# Patient Record
Sex: Female | Born: 1980 | Race: Black or African American | Hispanic: No | Marital: Married | Smoking: Never smoker
Health system: Southern US, Community
[De-identification: ages and names within clinical notes are randomized; demographics above are authoritative.]

## PROBLEM LIST (undated history)

## (undated) DIAGNOSIS — I10 Essential (primary) hypertension: Secondary | ICD-10-CM

## (undated) DIAGNOSIS — F419 Anxiety disorder, unspecified: Secondary | ICD-10-CM

## (undated) DIAGNOSIS — T7840XA Allergy, unspecified, initial encounter: Secondary | ICD-10-CM

## (undated) HISTORY — PX: SALPINGOOPHORECTOMY: SHX82

## (undated) HISTORY — DX: Allergy, unspecified, initial encounter: T78.40XA

## (undated) HISTORY — DX: Essential (primary) hypertension: I10

## (undated) HISTORY — DX: Anxiety disorder, unspecified: F41.9

---
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
## 2001-12-16 ENCOUNTER — Encounter: Admission: RE | Admit: 2001-12-16 | Discharge: 2001-12-16 | Payer: Self-pay | Admitting: Obstetrics and Gynecology

## 2001-12-16 ENCOUNTER — Encounter: Payer: Self-pay | Admitting: Obstetrics and Gynecology

## 2002-08-28 ENCOUNTER — Emergency Department (HOSPITAL_COMMUNITY): Admission: EM | Admit: 2002-08-28 | Discharge: 2002-08-28 | Payer: Self-pay | Admitting: Emergency Medicine

## 2002-11-18 ENCOUNTER — Encounter: Payer: Self-pay | Admitting: Emergency Medicine

## 2002-11-18 ENCOUNTER — Emergency Department (HOSPITAL_COMMUNITY): Admission: EM | Admit: 2002-11-18 | Discharge: 2002-11-18 | Payer: Self-pay | Admitting: Emergency Medicine

## 2003-03-11 ENCOUNTER — Other Ambulatory Visit: Admission: RE | Admit: 2003-03-11 | Discharge: 2003-03-11 | Payer: Self-pay | Admitting: Obstetrics & Gynecology

## 2003-11-12 HISTORY — PX: TUBAL LIGATION: SHX77

## 2004-02-24 ENCOUNTER — Emergency Department (HOSPITAL_COMMUNITY): Admission: EM | Admit: 2004-02-24 | Discharge: 2004-02-24 | Payer: Self-pay | Admitting: Emergency Medicine

## 2004-09-27 ENCOUNTER — Emergency Department (HOSPITAL_COMMUNITY): Admission: EM | Admit: 2004-09-27 | Discharge: 2004-09-27 | Payer: Self-pay | Admitting: Emergency Medicine

## 2005-11-11 HISTORY — PX: OOPHORECTOMY: SHX86

## 2006-07-08 ENCOUNTER — Emergency Department (HOSPITAL_COMMUNITY): Admission: EM | Admit: 2006-07-08 | Discharge: 2006-07-08 | Payer: Self-pay | Admitting: Emergency Medicine

## 2006-09-06 ENCOUNTER — Encounter: Payer: Self-pay | Admitting: Emergency Medicine

## 2006-09-08 ENCOUNTER — Inpatient Hospital Stay (HOSPITAL_COMMUNITY): Admission: AD | Admit: 2006-09-08 | Discharge: 2006-09-09 | Payer: Self-pay | Admitting: Obstetrics and Gynecology

## 2007-02-04 ENCOUNTER — Emergency Department (HOSPITAL_COMMUNITY): Admission: EM | Admit: 2007-02-04 | Discharge: 2007-02-04 | Payer: Self-pay | Admitting: Family Medicine

## 2007-08-04 ENCOUNTER — Emergency Department (HOSPITAL_COMMUNITY): Admission: EM | Admit: 2007-08-04 | Discharge: 2007-08-04 | Payer: Self-pay | Admitting: Emergency Medicine

## 2007-09-16 ENCOUNTER — Emergency Department (HOSPITAL_COMMUNITY): Admission: EM | Admit: 2007-09-16 | Discharge: 2007-09-16 | Payer: Self-pay | Admitting: Family Medicine

## 2008-03-03 LAB — CONVERTED CEMR LAB: Pap Smear: NORMAL

## 2008-06-26 ENCOUNTER — Emergency Department (HOSPITAL_BASED_OUTPATIENT_CLINIC_OR_DEPARTMENT_OTHER): Admission: EM | Admit: 2008-06-26 | Discharge: 2008-06-26 | Payer: Self-pay | Admitting: Emergency Medicine

## 2009-01-01 ENCOUNTER — Ambulatory Visit: Payer: Self-pay | Admitting: Diagnostic Radiology

## 2009-01-01 ENCOUNTER — Emergency Department (HOSPITAL_BASED_OUTPATIENT_CLINIC_OR_DEPARTMENT_OTHER): Admission: EM | Admit: 2009-01-01 | Discharge: 2009-01-01 | Payer: Self-pay | Admitting: Emergency Medicine

## 2009-01-18 ENCOUNTER — Ambulatory Visit: Payer: Self-pay | Admitting: *Deleted

## 2009-01-18 DIAGNOSIS — R51 Headache: Secondary | ICD-10-CM

## 2009-01-18 DIAGNOSIS — R519 Headache, unspecified: Secondary | ICD-10-CM | POA: Insufficient documentation

## 2009-01-18 DIAGNOSIS — M5126 Other intervertebral disc displacement, lumbar region: Secondary | ICD-10-CM

## 2009-01-18 DIAGNOSIS — M549 Dorsalgia, unspecified: Secondary | ICD-10-CM | POA: Insufficient documentation

## 2009-01-22 ENCOUNTER — Encounter: Admission: RE | Admit: 2009-01-22 | Discharge: 2009-01-22 | Payer: Self-pay | Admitting: *Deleted

## 2009-01-24 ENCOUNTER — Telehealth (INDEPENDENT_AMBULATORY_CARE_PROVIDER_SITE_OTHER): Payer: Self-pay | Admitting: *Deleted

## 2009-01-25 ENCOUNTER — Encounter: Admission: RE | Admit: 2009-01-25 | Discharge: 2009-01-25 | Payer: Self-pay | Admitting: *Deleted

## 2009-01-31 ENCOUNTER — Encounter (INDEPENDENT_AMBULATORY_CARE_PROVIDER_SITE_OTHER): Payer: Self-pay | Admitting: *Deleted

## 2009-03-20 ENCOUNTER — Ambulatory Visit: Payer: Self-pay | Admitting: Gastroenterology

## 2009-04-06 ENCOUNTER — Emergency Department (HOSPITAL_BASED_OUTPATIENT_CLINIC_OR_DEPARTMENT_OTHER): Admission: EM | Admit: 2009-04-06 | Discharge: 2009-04-06 | Payer: Self-pay | Admitting: Emergency Medicine

## 2009-04-13 ENCOUNTER — Ambulatory Visit: Payer: Self-pay | Admitting: Internal Medicine

## 2009-04-13 DIAGNOSIS — K2970 Gastritis, unspecified, without bleeding: Secondary | ICD-10-CM | POA: Insufficient documentation

## 2009-04-13 DIAGNOSIS — K2990 Gastroduodenitis, unspecified, without bleeding: Secondary | ICD-10-CM

## 2010-01-26 ENCOUNTER — Emergency Department (HOSPITAL_COMMUNITY): Admission: EM | Admit: 2010-01-26 | Discharge: 2010-01-26 | Payer: Self-pay | Admitting: Emergency Medicine

## 2011-02-26 LAB — BASIC METABOLIC PANEL
CO2: 25 mEq/L (ref 19–32)
Chloride: 107 mEq/L (ref 96–112)
Creatinine, Ser: 1 mg/dL (ref 0.4–1.2)
GFR calc Af Amer: >60 mL/min (ref >60)
Potassium: 4 mEq/L (ref 3.5–5.1)
Sodium: 140 mEq/L (ref 135–145)

## 2011-02-26 LAB — DIFFERENTIAL
Basophils Relative: 1 % (ref 0–1)
Eosinophils Absolute: 0.2 10*3/uL (ref 0.0–0.7)
Eosinophils Relative: 2 % (ref 0–5)
Monocytes Absolute: 0.8 10*3/uL (ref 0.1–1.0)
Monocytes Relative: 9 % (ref 3–12)

## 2011-02-26 LAB — URINALYSIS, ROUTINE W REFLEX MICROSCOPIC
Glucose, UA: NEGATIVE mg/dL
Hgb urine dipstick: NEGATIVE
Protein, ur: NEGATIVE mg/dL

## 2011-02-26 LAB — CBC
HCT: 38.9 % (ref 36.0–46.0)
Hemoglobin: 13.3 g/dL (ref 12.0–15.0)
MCHC: 34.1 g/dL (ref 30.0–36.0)
MCV: 84.5 fL (ref 78.0–100.0)
RBC: 4.6 MIL/uL (ref 3.87–5.11)

## 2011-03-12 ENCOUNTER — Inpatient Hospital Stay (INDEPENDENT_AMBULATORY_CARE_PROVIDER_SITE_OTHER)
Admission: RE | Admit: 2011-03-12 | Discharge: 2011-03-12 | Disposition: A | Discharge status: Home / Self Care | Payer: Self-pay | Source: Ambulatory Visit | Attending: Family Medicine | Admitting: Family Medicine

## 2011-03-12 DIAGNOSIS — J309 Allergic rhinitis, unspecified: Secondary | ICD-10-CM

## 2011-03-12 DIAGNOSIS — J019 Acute sinusitis, unspecified: Secondary | ICD-10-CM

## 2011-03-15 ENCOUNTER — Inpatient Hospital Stay (HOSPITAL_COMMUNITY): Payer: Self-pay

## 2011-03-15 ENCOUNTER — Emergency Department (HOSPITAL_COMMUNITY)
Admission: EM | Admit: 2011-03-15 | Discharge: 2011-03-15 | Disposition: A | Discharge status: Nursing Home (Includes ICF) | Payer: Self-pay | Source: Home / Self Care | Attending: Emergency Medicine | Admitting: Emergency Medicine

## 2011-03-15 ENCOUNTER — Emergency Department (HOSPITAL_COMMUNITY): Payer: Self-pay

## 2011-03-15 ENCOUNTER — Inpatient Hospital Stay (HOSPITAL_COMMUNITY)
Admission: AD | Admit: 2011-03-15 | Discharge: 2011-03-21 | DRG: 743 | Disposition: A | Discharge status: Home / Self Care | Payer: Self-pay | Source: Ambulatory Visit | Attending: Obstetrics & Gynecology | Admitting: Obstetrics & Gynecology

## 2011-03-15 DIAGNOSIS — R109 Unspecified abdominal pain: Secondary | ICD-10-CM | POA: Insufficient documentation

## 2011-03-15 DIAGNOSIS — R300 Dysuria: Secondary | ICD-10-CM | POA: Insufficient documentation

## 2011-03-15 DIAGNOSIS — R1900 Intra-abdominal and pelvic swelling, mass and lump, unspecified site: Secondary | ICD-10-CM

## 2011-03-15 DIAGNOSIS — K5900 Constipation, unspecified: Secondary | ICD-10-CM | POA: Diagnosis present

## 2011-03-15 DIAGNOSIS — R110 Nausea: Secondary | ICD-10-CM | POA: Insufficient documentation

## 2011-03-15 DIAGNOSIS — R1032 Left lower quadrant pain: Secondary | ICD-10-CM | POA: Diagnosis present

## 2011-03-15 DIAGNOSIS — N83209 Unspecified ovarian cyst, unspecified side: Principal | ICD-10-CM | POA: Diagnosis present

## 2011-03-15 DIAGNOSIS — N949 Unspecified condition associated with female genital organs and menstrual cycle: Secondary | ICD-10-CM | POA: Insufficient documentation

## 2011-03-15 DIAGNOSIS — R197 Diarrhea, unspecified: Secondary | ICD-10-CM | POA: Insufficient documentation

## 2011-03-15 LAB — BASIC METABOLIC PANEL
BUN: 10 mg/dL (ref 6–23)
Chloride: 103 mEq/L (ref 96–112)
Creatinine, Ser: 0.89 mg/dL (ref 0.4–1.2)
Glucose, Bld: 104 mg/dL — ABNORMAL HIGH (ref 70–99)

## 2011-03-15 LAB — URINALYSIS, ROUTINE W REFLEX MICROSCOPIC
Glucose, UA: NEGATIVE mg/dL
Hgb urine dipstick: NEGATIVE
Ketones, ur: NEGATIVE mg/dL
Protein, ur: NEGATIVE mg/dL
pH: 6 (ref 5.0–8.0)

## 2011-03-15 LAB — CBC
HCT: 38.5 % (ref 36.0–46.0)
MCH: 28 pg (ref 26.0–34.0)
MCV: 84.2 fL (ref 78.0–100.0)
Platelets: 380 10*3/uL (ref 150–400)
RBC: 4.57 MIL/uL (ref 3.87–5.11)
RDW: 13.3 % (ref 11.5–15.5)

## 2011-03-15 LAB — DIFFERENTIAL
Eosinophils Absolute: 0.3 10*3/uL (ref 0.0–0.7)
Eosinophils Relative: 4 % (ref 0–5)
Lymphocytes Relative: 31 % (ref 12–46)
Lymphs Abs: 2.3 10*3/uL (ref 0.7–4.0)
Monocytes Relative: 9 % (ref 3–12)

## 2011-03-17 DIAGNOSIS — R1032 Left lower quadrant pain: Secondary | ICD-10-CM

## 2011-03-17 DIAGNOSIS — K5900 Constipation, unspecified: Secondary | ICD-10-CM

## 2011-03-17 DIAGNOSIS — N83209 Unspecified ovarian cyst, unspecified side: Secondary | ICD-10-CM

## 2011-03-20 ENCOUNTER — Ambulatory Visit (HOSPITAL_COMMUNITY): Payer: Self-pay | Attending: Obstetrics & Gynecology

## 2011-03-20 ENCOUNTER — Other Ambulatory Visit: Payer: Self-pay | Admitting: Diagnostic Radiology

## 2011-03-20 DIAGNOSIS — N83209 Unspecified ovarian cyst, unspecified side: Secondary | ICD-10-CM | POA: Insufficient documentation

## 2011-03-20 DIAGNOSIS — Z9079 Acquired absence of other genital organ(s): Secondary | ICD-10-CM | POA: Insufficient documentation

## 2011-03-20 DIAGNOSIS — R1032 Left lower quadrant pain: Secondary | ICD-10-CM | POA: Insufficient documentation

## 2011-03-20 LAB — APTT: aPTT: 31 seconds (ref 24–37)

## 2011-03-20 LAB — CBC
HCT: 36.4 % (ref 36.0–46.0)
MCH: 27.7 pg (ref 26.0–34.0)
MCHC: 32.7 g/dL (ref 30.0–36.0)
MCV: 84.8 fL (ref 78.0–100.0)
Platelets: 351 10*3/uL (ref 150–400)
RDW: 13.2 % (ref 11.5–15.5)
WBC: 7.6 10*3/uL (ref 4.0–10.5)

## 2011-03-22 NOTE — Discharge Summary (Signed)
[name], [name]              ACCOUNT NO.:  [id number]  MEDICAL RECORD NO.:  [id number]           PATIENT TYPE:  I  LOCATION:  9318                          [facility]  [facility]  PHYSICIAN:  Tanya S. Shawnie Pons, M.D.   DATE OF BIRTH:  11/10/1981  DATE OF ADMISSION:  03/15/2011 DATE OF DISCHARGE:  03/21/2011                              DISCHARGE SUMMARY   FINAL DIAGNOSES: 1. Acute abdominal pain related to an ovarian cyst. 2. History of pelvic adhesive disease.  PERTINENT LABORATORY VALUES:  CBC with hemoglobin of 11.9, white count of 7.6.  Normal coags.  Other pertinent findings include pelvic sonography which revealed a 5.9 x 4.7 x 4.3 cm left adnexal cyst that appeared benign and may have been related to an ovarian remnant. Supposedly, she had had an LSO, however, upon review of the operative reports from that procedure it was unclear they were able to get her entire ovary out.  REASON FOR ADMISSION:  Briefly, please see H and P on the chart.  The patient is a 30 year old female who is a G0, who presented with sudden onset of left lower quadrant pain to the [facility] ED, was treated by IV morphine and Dilaudid, and needed to be admitted for pain control.  HOSPITAL COURSE:  The patient was admitted by Dr. Harold Hedge who transferred the patient to the faculty practice on hospital day #2.  She was placed on fentanyl PCA for a couple of days.  She was then transitioned over to IV Toradol and IV Dilaudid which the patient refused.  The patient continued to have pain and was monitored for a couple of more days until she was saying she had too much pain to go home and Interventional Radiology was consulted.  At that time, they placed a drain through the iliopsoas and drained 42 mL of fluid.  Cyst went down by several centimeters.  On the next day, day of discharge, the patient reported that her pain was somewhat improved.  She continued to have some nausea, though was on just IM  Toradol for pain relief.  Her abdominal exam was somewhat improved, so it was felt she was stable for discharge.  DISCHARGE DISPOSITION AND CONDITION:  She is discharged home in improved condition.  Followup will be on Apr 04, 2011, at 4 p.m. in the GYN Clinic.  DISCHARGE MEDICATIONS:  Allegra 60 mg p.o. b.i.d. as needed.  She will stop Zithromax which she was on prior to admission.  Vitaline 1 tablet daily by mouth. she can receive at home, Zyrtec one daily as needed, she should not take that with her Allegra.  Flonase 2 sprays each nostril daily as needed.  New medications include diclofenac 75 mg twice daily with food as needed for pain and Phenergan 25 mg every 4-6 hours p.r.n. nausea.  The patient was instructed to increase her activity slowly and that she continued to have resolution of her pain. She will need a followup ultrasound in 6 weeks.     Shelbie Proctor. Shawnie Pons, M.D.     TSP/MEDQ  D:  03/21/2011  T:  03/21/2011  Job:  [id number]  Electronically Signed by Tinnie Gens M.D. on 03/22/2011 01:36:05 PM

## 2011-03-29 NOTE — Discharge Summary (Signed)
[name], [name]              ACCOUNT NO.:  [id number]   MEDICAL RECORD NO.:  [id number]          PATIENT TYPE:  INP   LOCATION:  9318                          [facility]ILITY:  [facility]   PHYSICIAN:  James A. Ashley Royalty, M.D.DATE OF BIRTH:  [birth year]   DATE OF ADMISSION:  09/07/2006  DATE OF DISCHARGE:  09/09/2006                               DISCHARGE SUMMARY   DISCHARGE DIAGNOSES:  1. Left ovarian cyst.  2. Left lower quadrant pain.   OPERATIONS AND SPECIAL PROCEDURES:  None.   CONSULTATIONS:  None.   DISCHARGE MEDICATIONS:  Fentanyl 0.5 mcg patch.   HISTORY AND PHYSICAL:  This is a 30 year old gravida 1, para 0, AB 1,  whom I evaluated in the context of unassigned GYN call.  She was  transferred from the [facility] emergency department complaining of  sharp pain in the left lower quadrant.  She was evaluated with  ultrasound, which revealed a 6 cm complex ovarian cyst with peripheral  vascular flow along the margins.  The patient was a patient of Dr.  Ruby Cola and had undergone bilateral salpingectomy December 2005 in an  effort to optimize her fertility.  She was admitted for pain control.  For the remainder of the history and physical, please see chart.   HOSPITAL COURSE:  The patient was admitted to the [facility]  [place].  Admission laboratory studies were drawn.  She had an  initial temperature of 100.5 degrees Fahrenheit.  She was given Vicodin  for pain.  Dr. Ruby Cola was called and returned my call on September 08, 2006.  We formulated a plan that he was to receive the patient back and  manage her left ovarian cyst in the context of her fertility treatment.  On September 09, 2006, the patient was noted to be afebrile.  She was  given a fentanyl patch.  She seemed to tolerate that well and was a  candidate for transfer back to the care of Dr. Ruby Cola.  As her pain  was still substantial, I communicated with him and we sent her directly  to [facility]  rather than to his office.  He will resume care  there.  The patient was thus discharged and transferred to the care of  Dr. Ruby Cola on September 09, 2006.   DISPOSITION:  As above.      James A. Ashley Royalty, M.D.  Electronically Signed     JAM/MEDQ  D:  10/16/2006  T:  10/16/2006  Job:  [id number]

## 2011-04-04 ENCOUNTER — Ambulatory Visit (INDEPENDENT_AMBULATORY_CARE_PROVIDER_SITE_OTHER): Payer: Self-pay | Admitting: Family Medicine

## 2011-04-04 DIAGNOSIS — N83209 Unspecified ovarian cyst, unspecified side: Secondary | ICD-10-CM

## 2011-04-05 NOTE — Group Therapy Note (Signed)
[name], [name]              ACCOUNT NO.:  [id number]  MEDICAL RECORD NO.:  [id number]           PATIENT TYPE:  A  LOCATION:  [facility]                   [facility]  [facility]  PHYSICIAN:  Tinnie Gens, MD        DATE OF BIRTH:  [birth year]  DATE OF SERVICE:  04/04/2011                                 CLINIC NOTE  CHIEF COMPLAINT:  Hospital followup.  HISTORY OF PRESENT ILLNESS:  The patient is a 29 year old nulliparous patient who was admitted from May 4 to May 10.  She was admitted with abdominal pain related to an ovarian cyst.  She had it drained through Interventional Radiology and has done well.  She has been home for a little bit and reports that her pain is much improved.  She is between jobs and do not have health insurance at present.  She thinks that Dr. Ruby Cameron took out both of her tubes and her left ovary but she is interested in getting pregnant and so we discussed that at length today.  PAST MEDICAL HISTORY:  Negative.  PAST SURGICAL HISTORY:  She had an appendectomy at 2, bilateral tubal removal in 2005 and left ovary removal in 2007.  MEDICATIONS:  She is on; 1. Diclofenac 75 mg 2 p.o. daily. 2. Phenergan 25 mg p.o. daily. 3. Allegra-D 1 p.o. daily as needed.  ALLERGIES:  PENICILLIN, CODEINE, MORPHINE, HYDROMORPHONE.  OBSTETRICAL HISTORY:  She is G0.  GYNECOLOGIC HISTORY:  Menarche at age 7.  Cycles are 22 days and lasts for 4 days with moderately moderate pain.  She has a history of ovarian cyst.  No history of abnormal Pap.  Last Pap was in 2009.  FAMILY HISTORY:  Hypertension in mother.  SOCIAL HISTORY:  No tobacco, alcohol or drug use.  She is working as a Hospital doctor for [employer].  She has a degree in child care.  She is married.  No kids.  REVIEW OF SYSTEMS:  14-point review of systems reviewed.  Complains of weight gain, headache, abdominal pain, constipation from pain medications.  PHYSICAL EXAMINATION:  Today; VITAL SIGNS:   As noted in the chart. GENERAL:  She is a well-developed, well-nourished female in no acute distress. ABDOMEN:  Soft, nontender, nondistended.  IMPRESSION: 1. Pelvic adhesive disease. 2. History of ovarian cyst now resolving status post hospitalization.  PLAN:  We will send the patient to [facility].  She needs a Pap smear in the next 6-8 weeks.  Also the patient will like copy of her records. We did discuss the fertility and she will look into that when she has her health insurance.          ______________________________ Tinnie Gens, MD    TP/MEDQ  D:  04/04/2011  T:  04/05/2011  Job:  [id number]

## 2011-04-14 ENCOUNTER — Inpatient Hospital Stay (HOSPITAL_COMMUNITY)
Admission: AD | Admit: 2011-04-14 | Discharge: 2011-04-14 | Disposition: A | Discharge status: Home / Self Care | Payer: Self-pay | Source: Ambulatory Visit | Attending: Obstetrics and Gynecology | Admitting: Obstetrics and Gynecology

## 2011-04-14 ENCOUNTER — Inpatient Hospital Stay (HOSPITAL_COMMUNITY): Payer: Self-pay

## 2011-04-14 DIAGNOSIS — O99891 Other specified diseases and conditions complicating pregnancy: Secondary | ICD-10-CM | POA: Insufficient documentation

## 2011-04-14 DIAGNOSIS — R1032 Left lower quadrant pain: Secondary | ICD-10-CM | POA: Insufficient documentation

## 2011-04-14 DIAGNOSIS — O9989 Other specified diseases and conditions complicating pregnancy, childbirth and the puerperium: Secondary | ICD-10-CM

## 2011-04-14 LAB — CBC
HCT: 38.4 % (ref 36.0–46.0)
Hemoglobin: 13 g/dL (ref 12.0–15.0)
MCH: 28.8 pg (ref 26.0–34.0)
MCHC: 33.9 g/dL (ref 30.0–36.0)
MCV: 85.1 fL (ref 78.0–100.0)
RBC: 4.51 MIL/uL (ref 3.87–5.11)

## 2011-04-14 LAB — URINALYSIS, ROUTINE W REFLEX MICROSCOPIC
Bilirubin Urine: NEGATIVE
Glucose, UA: NEGATIVE mg/dL
Ketones, ur: 15 mg/dL — AB
Specific Gravity, Urine: >1.03 — ABNORMAL HIGH (ref 1.005–1.030)
pH: 6.5 (ref 5.0–8.0)

## 2011-04-14 LAB — URINE MICROSCOPIC-ADD ON

## 2011-05-16 ENCOUNTER — Other Ambulatory Visit: Payer: Self-pay | Admitting: Family Medicine

## 2011-05-16 ENCOUNTER — Ambulatory Visit (INDEPENDENT_AMBULATORY_CARE_PROVIDER_SITE_OTHER): Payer: Self-pay | Admitting: Family Medicine

## 2011-05-16 DIAGNOSIS — Z01419 Encounter for gynecological examination (general) (routine) without abnormal findings: Secondary | ICD-10-CM

## 2011-05-17 ENCOUNTER — Emergency Department (INDEPENDENT_AMBULATORY_CARE_PROVIDER_SITE_OTHER): Payer: Self-pay

## 2011-05-17 ENCOUNTER — Emergency Department (HOSPITAL_BASED_OUTPATIENT_CLINIC_OR_DEPARTMENT_OTHER)
Admission: EM | Admit: 2011-05-17 | Discharge: 2011-05-17 | Disposition: A | Discharge status: Home / Self Care | Payer: Self-pay | Attending: Emergency Medicine | Admitting: Emergency Medicine

## 2011-05-17 DIAGNOSIS — W278XXA Contact with other nonpowered hand tool, initial encounter: Secondary | ICD-10-CM

## 2011-05-17 DIAGNOSIS — M795 Residual foreign body in soft tissue: Secondary | ICD-10-CM | POA: Insufficient documentation

## 2011-05-17 DIAGNOSIS — Z1889 Other specified retained foreign body fragments: Secondary | ICD-10-CM | POA: Insufficient documentation

## 2011-05-17 DIAGNOSIS — S60459A Superficial foreign body of unspecified finger, initial encounter: Secondary | ICD-10-CM

## 2011-05-17 NOTE — Group Therapy Note (Signed)
NAMEPRITIKA, [name]              ACCOUNT NO.:  [id number]  MEDICAL RECORD NO.:  [id number]           PATIENT TYPE:  E  LOCATION:  [facility]                    FACILITY:  [facility]  PHYSICIAN:  Tinnie Gens, MD        DATE OF BIRTH:  1981/09/27  DATE OF SERVICE:  05/16/2011                                 CLINIC NOTE  CHIEF COMPLAINT:  Yearly exam with Pap.  HISTORY OF PRESENT ILLNESS:  The patient is a 30 year old nulliparous patient.  She was previously seen in this hospital followup on 04/04/2011.  At that time, she was admitted with pain related to an ovarian cyst, which was drained through interventional radiology.  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  She is to see Rudell Cobb after her last visit.  Her mom had recent brain surgery in [place]. She has been only taking care of her.  The patient has had both tubes removed.  IVF for fertility purposes on a later date.  The patient has mild right-sided pelvic pain currently, which was stabbing at times. She also noted some increased breast pain after starting oral contraceptives.  Additionally, she had increased cramping with her last period and her cycle was left.  We could start her on oral contraceptives at her last visit that we keep her ovary somewhat quiescent.  PAST MEDICAL HISTORY:  Negative.  PAST SURGICAL HISTORY:  Appendectomy at age 35, bilateral tubal removal in 2005, left ovary removal in 2007.  MEDICATIONS:  Diclofenac 75 mg two p.o. daily, Phenergan 25 mg one p.o. daily p.r.n., Allegra-D one p.o. daily as needed, Alcon 135 one p.o. daily.  ALLERGIES:  PENICILLIN, CODEINE, MORPHINE, HYDROMORPHONE.  OBSTETRICAL HISTORY:  She is a G 0.  GYN HISTORY:  Menarche at age 45.  Cycles 22 days, last 4 days with moderate pain, history of ovarian cysts.  No history of abnormal Pap.  FAMILY HISTORY:  Hypertension.  SOCIAL HISTORY:  She denies tobacco, alcohol, or drug use.  FAMILY HISTORY:  Significant for  hypertension in her mother.  Her mother also had a benign brain tumor hemangioma removed from her head.  REVIEW OF SYSTEMS:  A 14-point review of systems reviewed with exception of the HPI that is negative.  PHYSICAL EXAMINATION:  Vitals:  As noted in the chart.  General:  She is a well-nourished female in no acute distress.  Lungs:  Clear bilaterally.  CVS:  Regular rate and rhythm without murmurs.  Neck: Supple.  Normal thyroid.  Abdomen:  Soft, nontender, nondistended. Extremities:  No cyanosis, clubbing, or edema, 2+ distal pulses. Breasts:  Symmetric with everted nipples.  No masses, supraclavicular or axillary lymphadenopathy.  GU:  Normal external female genitalia. Vagina:  Pink and rugated.  Cervix is nulliparous without lesion. Uterus is small, anteverted.  No adnexal mass or tenderness.  IMPRESSION: 1. Gyn exam with Pap. 2. Chronic pelvic pain with multiple previous surgeries, history of     right ovarian cyst.  PLAN: 1. Continue oral contraceptives. 2. Pap smear today. 3. She will follow up in September to see how the oral contraceptives are  working for a pelvic pain.          ______________________________ Tinnie Gens, MD    TP/MEDQ  D:  05/16/2011  T:  05/17/2011  Job:  [id number]

## 2011-08-21 ENCOUNTER — Inpatient Hospital Stay (HOSPITAL_COMMUNITY)
Admission: RE | Admit: 2011-08-21 | Discharge: 2011-08-21 | Disposition: A | Discharge status: Home / Self Care | Payer: Self-pay | Source: Ambulatory Visit | Attending: Family Medicine | Admitting: Family Medicine

## 2011-11-12 HISTORY — PX: OVARIAN CYST SURGERY: SHX726

## 2012-01-25 IMAGING — US US TRANSVAGINAL NON-OB
1 series · 14 of 25 positions shown · non-contrast
Comparison: CT pelvis of 03/20/2011 and ultrasound pelvis of
03/15/2011

CLINICAL DATA: Pain

TRANSVAGINAL ULTRASOUND OF PELVIS
TECHNIQUE: Transvaginal ultrasound examination of the pelvis was
performed including evaluation of the uterus, ovaries, adnexal
regions, and pelvic cul-de-sac.

[Series 1: us transvaginal non-ob · 14 of 27 slices shown]
[im 1/27]
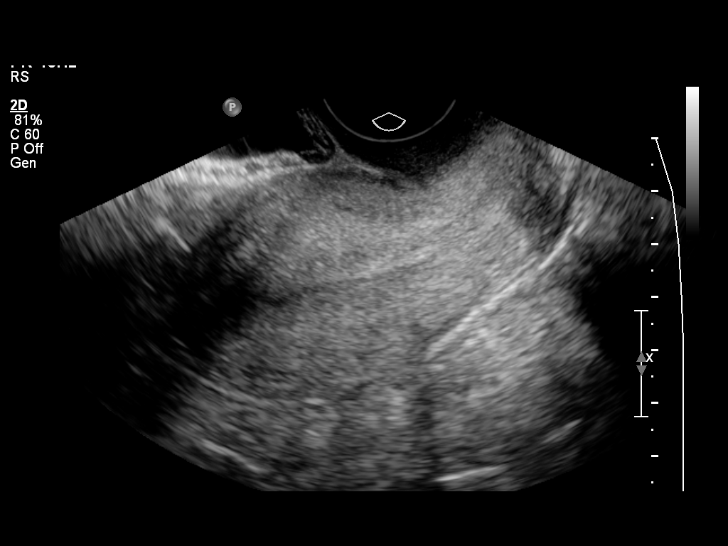
[im 3/27]
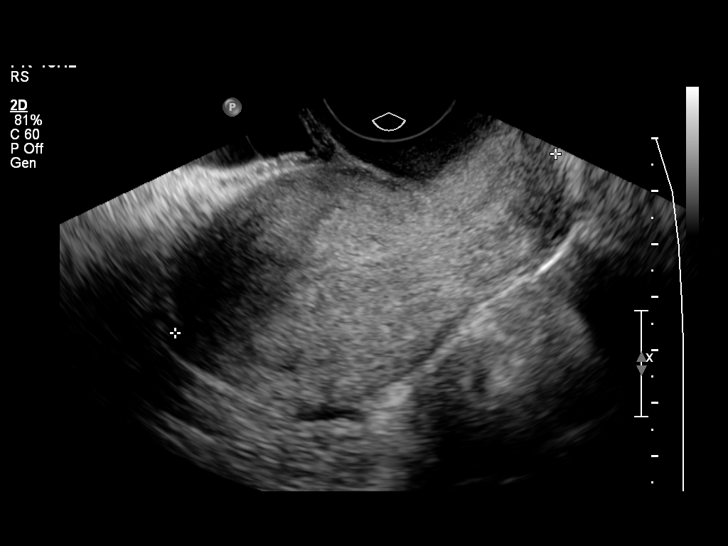
[im 5/27]
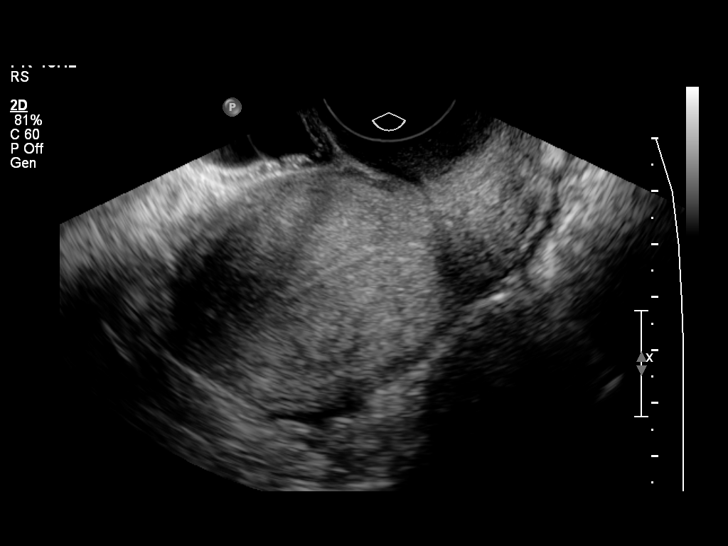
[im 7/27]
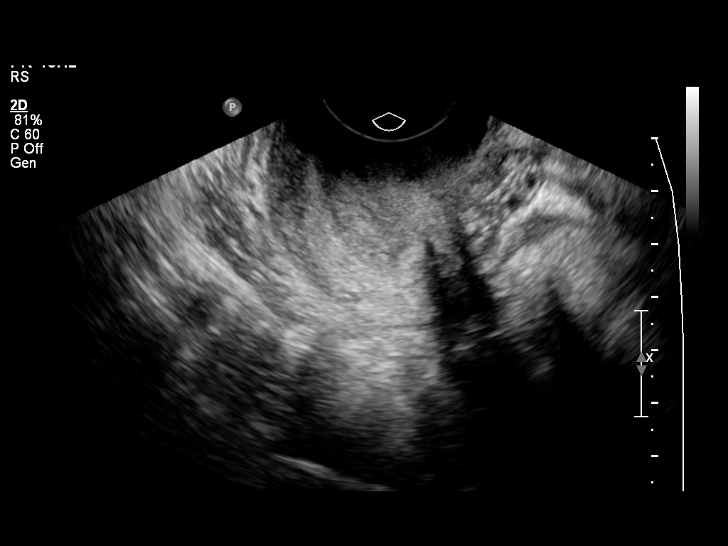
[im 9/27]
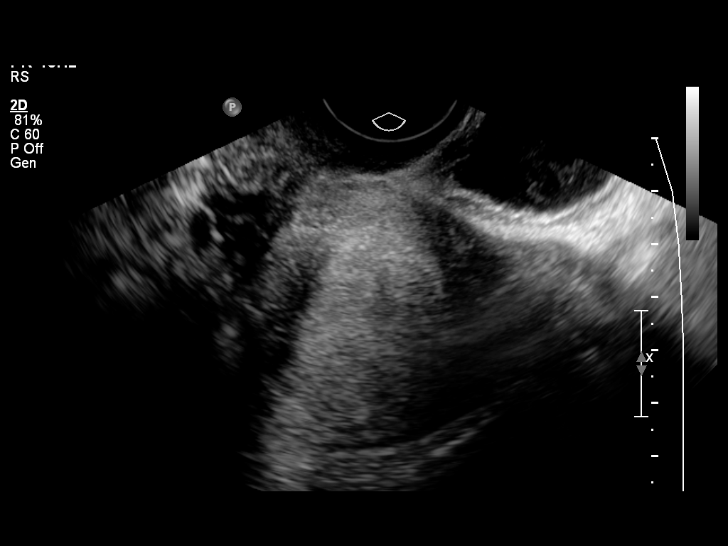
[im 10/27]
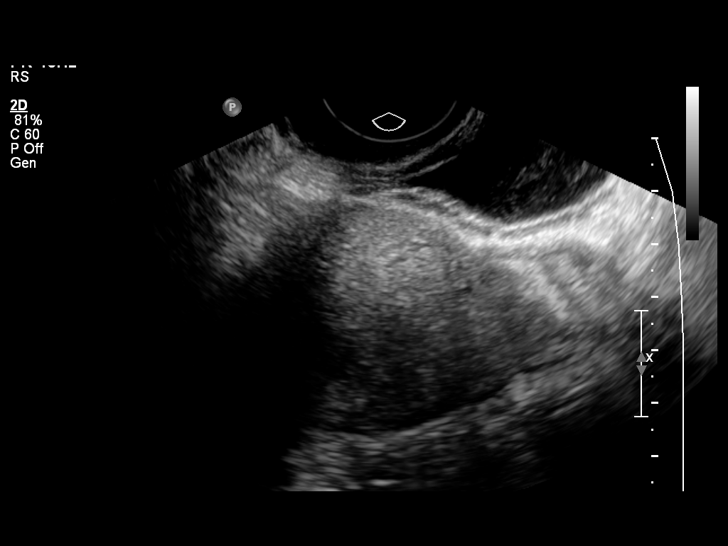
[im 12/27]
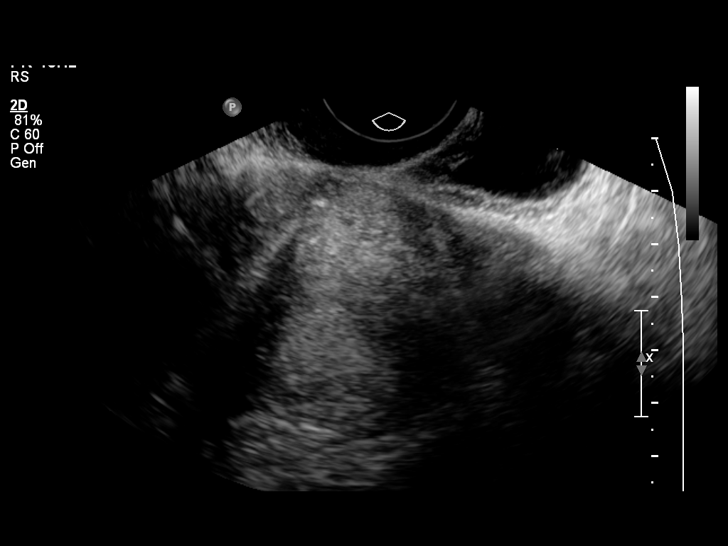
[im 15/27]
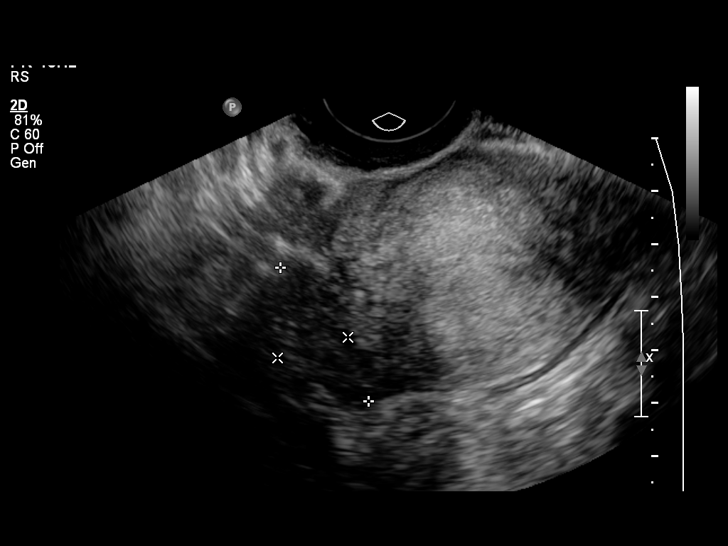
[im 17/27]
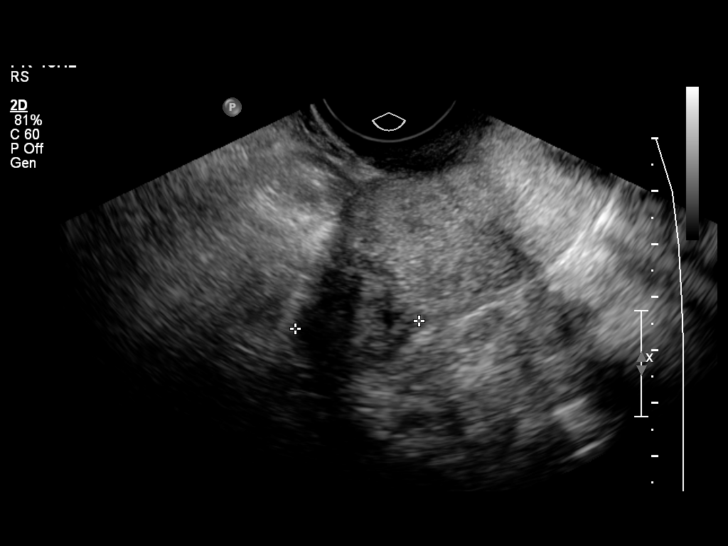
[im 18/27]
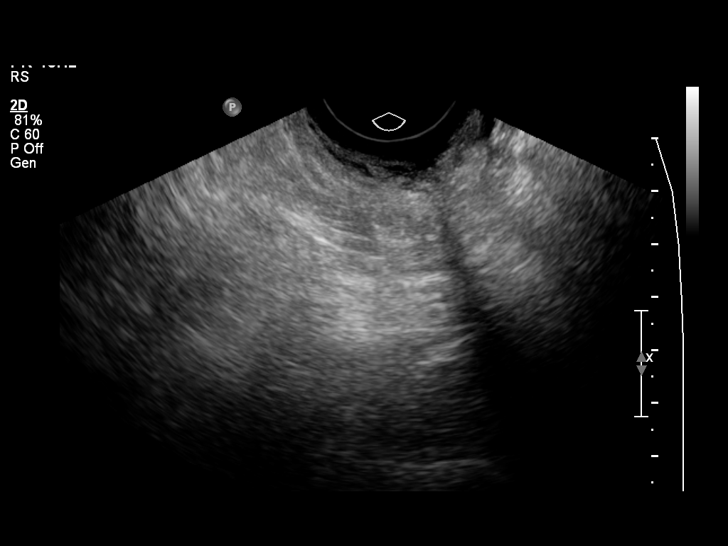
[im 20/27]
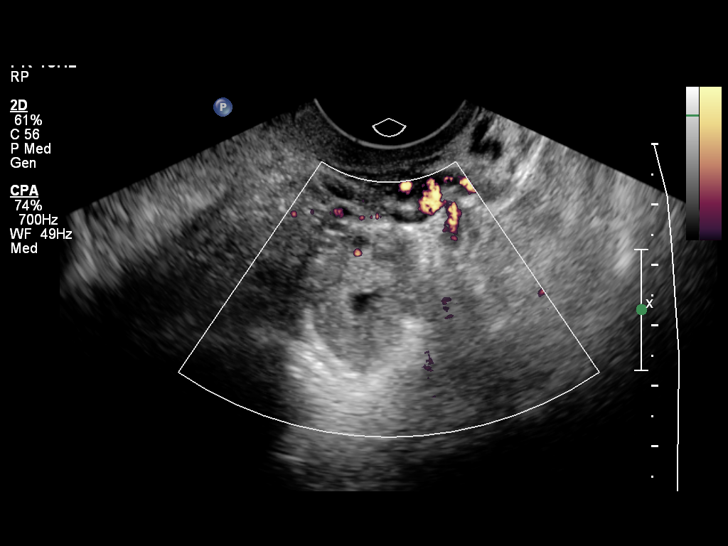
[im 22/27]
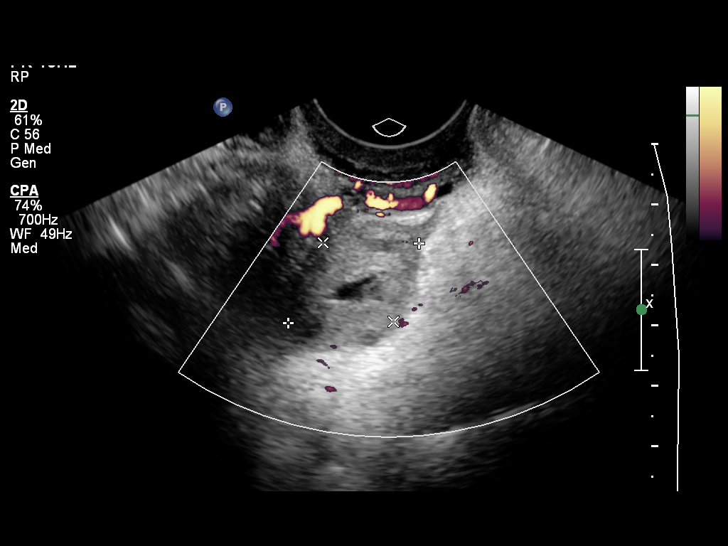
[im 24/27]
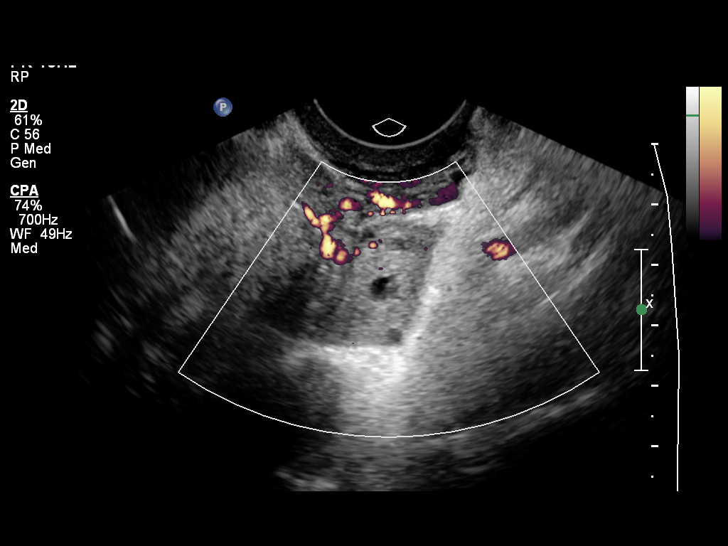
[im 27/27]
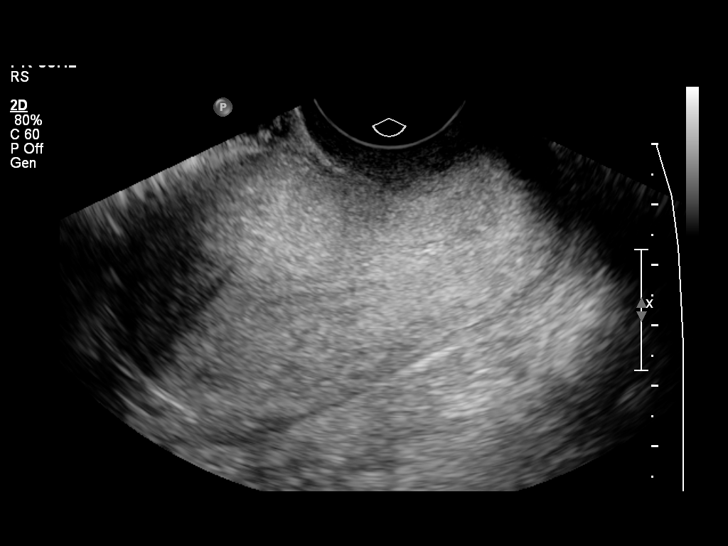

[14 of 25 positions shown; findings below may reference images not displayed]

FINDINGS: Uterus the uterus measures 7.9 cm sagittally with a depth of 4.3 cm
and width of 5.2 cm.

Endometrium the endometrium is normal in thickness measuring 3 mm.

Right Ovary  the right ovary is normal measuring 3.0 x 1.4 x
cm.

Left Ovary the left ovary measures 2.5 x 1.8 x 1.7 cm.  The
previously identified left adnexal cyst has been almost completely
aspirated.

Other Findings:  No free fluid is noted.
IMPRESSION: 1.  No uterine or myometrial abnormality.
2.  The previously described left adnexal cyst has been aspirated.
No free fluid is seen.

## 2012-03-19 ENCOUNTER — Telehealth: Payer: Self-pay | Admitting: *Deleted

## 2012-03-19 NOTE — Telephone Encounter (Signed)
Pt called and left a message wanting to know when her last pap smear was. I informed her it was July of 2012. Pt requested a copy of the result, I advised her to come by office and sign release and we can give it to her.

## 2012-08-12 ENCOUNTER — Ambulatory Visit
Admission: RE | Admit: 2012-08-12 | Discharge: 2012-08-12 | Disposition: A | Discharge status: Home / Self Care | Payer: BC Managed Care – PPO | Source: Ambulatory Visit | Attending: Chiropractic Medicine | Admitting: Chiropractic Medicine

## 2012-08-12 ENCOUNTER — Other Ambulatory Visit: Payer: Self-pay | Admitting: Chiropractic Medicine

## 2012-08-12 DIAGNOSIS — M5412 Radiculopathy, cervical region: Secondary | ICD-10-CM

## 2012-08-12 DIAGNOSIS — M545 Low back pain: Secondary | ICD-10-CM

## 2012-08-12 DIAGNOSIS — M542 Cervicalgia: Secondary | ICD-10-CM

## 2012-12-18 ENCOUNTER — Encounter (HOSPITAL_BASED_OUTPATIENT_CLINIC_OR_DEPARTMENT_OTHER): Payer: Self-pay

## 2012-12-18 ENCOUNTER — Emergency Department (HOSPITAL_BASED_OUTPATIENT_CLINIC_OR_DEPARTMENT_OTHER)
Admission: EM | Admit: 2012-12-18 | Discharge: 2012-12-18 | Disposition: A | Discharge status: Home / Self Care | Payer: BC Managed Care – PPO | Attending: Emergency Medicine | Admitting: Emergency Medicine

## 2012-12-18 DIAGNOSIS — M79603 Pain in arm, unspecified: Secondary | ICD-10-CM

## 2012-12-18 DIAGNOSIS — R209 Unspecified disturbances of skin sensation: Secondary | ICD-10-CM | POA: Insufficient documentation

## 2012-12-18 DIAGNOSIS — R5381 Other malaise: Secondary | ICD-10-CM | POA: Insufficient documentation

## 2012-12-18 DIAGNOSIS — Z7982 Long term (current) use of aspirin: Secondary | ICD-10-CM | POA: Insufficient documentation

## 2012-12-18 DIAGNOSIS — M79609 Pain in unspecified limb: Secondary | ICD-10-CM | POA: Insufficient documentation

## 2012-12-18 MED ORDER — NAPROXEN 375 MG PO TABS: 375.0000 mg | ORAL_TABLET | Freq: Two times a day (BID) | ORAL | Status: DC

## 2012-12-18 NOTE — ED Notes (Signed)
Pt complains of numbness in right arm that radiates into shoulder.  Describes it as a throbbing ache and inability to pick daughter up.

## 2012-12-18 NOTE — ED Provider Notes (Signed)
Medical screening examination/treatment/procedure(s) were performed by non-physician practitioner and as supervising physician I was immediately available for consultation/collaboration.   Simranjit Thayer, MD 12/18/12 2245 

## 2012-12-18 NOTE — ED Provider Notes (Signed)
History     CSN: [id number]  Arrival date & time 12/18/12  2103   First MD Initiated Contact with Patient 12/18/12 2105      Chief Complaint  Patient presents with  . Shoulder Pain    (Consider location/radiation/quality/duration/timing/severity/associated sxs/prior treatment) HPI [name] is a 32 y.o. female who presents to ED with complaint of right arm pain. states pain started about 1-2 months ago in right forearm, elbow, and was radiating into right wrist. States now pain radiating up the shoulder. Pt states her 3rd and 4th fingers feel tingly. States entire arm is throbbing. No injury. No swelling. No neck pain. Pain in arm is intermittent, worse at night, improved with aspirin. States works at a An[employer]. Does not lift anything heavy. Denies fever, chills, malaise.   History reviewed. No pertinent past medical history.  Past Surgical History  Procedure Date  . Appendectomy   . Tubal ligation   . Oophorectomy   . Ovarian cyst surgery     No family history on file.  History  Substance Use Topics  . Smoking status: Never Smoker   . Smokeless tobacco: Not on file  . Alcohol Use: No    OB History    Grav Para Term Preterm Abortions TAB SAB Ect Mult Living                  Review of Systems  Constitutional: Negative for fever and chills.  HENT: Negative for neck pain and neck stiffness.   Respiratory: Negative.   Cardiovascular: Negative.   Musculoskeletal: Positive for myalgias and arthralgias.  Skin: Negative for rash.  Neurological: Positive for weakness and numbness.    Allergies  Codeine; Hydrocodone; and Penicillins  Home Medications   Current Outpatient Rx  Name  Route  Sig  Dispense  Refill  . ASPIRIN 81 MG PO TABS   Oral   Take 81 mg by mouth daily.         Marland Kitchen NAPROXEN 375 MG PO TABS   Oral   Take 1 tablet (375 mg total) by mouth 2 (two) times daily.   20 tablet   0     Pulse 81  Temp 99.2 F (37.3 C) (Oral)  Resp 16  Ht 5'  4.5" (1.638 m)  Wt 201 lb (91.173 kg)  BMI 33.97 kg/m2  SpO2 100%  LMP 12/06/2012  Physical Exam  Nursing note and vitals reviewed. Constitutional: She is oriented to person, place, and time. She appears well-developed and well-nourished. No distress.  Eyes: Conjunctivae normal are normal.  Neck: Normal range of motion. Neck supple.  Cardiovascular: Normal rate, regular rhythm and normal heart sounds.   Pulmonary/Chest: Effort normal and breath sounds normal. No respiratory distress. She has no wheezes. She has no rales.  Musculoskeletal: She exhibits no edema.       Right arm normal appearing with no swelling, redness, bruising. Tender to palpation over right shoulder, right upper arm over both bicep and tricep, over deltoid, over forearm, over both flexer and extensor tendons. Over entire hand and wrist. Pt has normal grip strength of right hand. Unable to move wrist in any of directions. Refuses to move elbow. Full ROM of the shoulder joint. Normal radial distal pulse. Norma sensation over all fingers  Neurological: She is alert and oriented to person, place, and time.  Skin: Skin is warm.    ED Course  Procedures (including critical care time)  Labs Reviewed - No data to display No results  found.   1. Arm pain       MDM  Pt with non specific DIFFUSE tenderness from tips of her fingers to the shoulder joint into anterior chest. DDx includes cervical radiculopathy, myalgia, muscle strain, rheumatological condition. i do not see any sings of infection, no signs of septic joint, pulses normal, no swelling, doubt dvt. Pt instructed to follow up with PCP or orthopedics.   I offered pt a sling or a splint for comfort which she refused. Pt stated she is not comfortable going home not knowing what is going on with her arm. explaiend to her I do not think this is infection or blood clot, and do not think this is life threatening. Not sure what other tests we are able to do at this time,  and that she probably should go see a specialist for re evaluation and further blood work and tests. And off note, pt has had these symptoms for 1-2 months.   Pt walked out of ED before getting her discharge papers.           Lottie Mussel, PA 12/18/12 2206

## 2012-12-18 NOTE — ED Notes (Addendum)
Has taken baby aspirin with relief.  Denies known injury.

## 2012-12-18 NOTE — ED Notes (Signed)
Pt. Walked out of ED prior to receiving discharge papers.

## 2013-10-05 ENCOUNTER — Emergency Department (HOSPITAL_COMMUNITY)
Admission: EM | Admit: 2013-10-05 | Discharge: 2013-10-06 | Disposition: A | Discharge status: Home / Self Care | Payer: BC Managed Care – PPO | Attending: Emergency Medicine | Admitting: Emergency Medicine

## 2013-10-05 ENCOUNTER — Emergency Department (HOSPITAL_COMMUNITY): Payer: BC Managed Care – PPO

## 2013-10-05 ENCOUNTER — Encounter (HOSPITAL_COMMUNITY): Payer: Self-pay | Admitting: Emergency Medicine

## 2013-10-05 DIAGNOSIS — Z79899 Other long term (current) drug therapy: Secondary | ICD-10-CM | POA: Insufficient documentation

## 2013-10-05 DIAGNOSIS — Z9851 Tubal ligation status: Secondary | ICD-10-CM | POA: Insufficient documentation

## 2013-10-05 DIAGNOSIS — Z7982 Long term (current) use of aspirin: Secondary | ICD-10-CM | POA: Insufficient documentation

## 2013-10-05 DIAGNOSIS — R1032 Left lower quadrant pain: Secondary | ICD-10-CM | POA: Insufficient documentation

## 2013-10-05 DIAGNOSIS — Z880 Allergy status to penicillin: Secondary | ICD-10-CM | POA: Insufficient documentation

## 2013-10-05 DIAGNOSIS — O039 Complete or unspecified spontaneous abortion without complication: Secondary | ICD-10-CM | POA: Insufficient documentation

## 2013-10-05 LAB — URINALYSIS, ROUTINE W REFLEX MICROSCOPIC
Bilirubin Urine: NEGATIVE
Glucose, UA: NEGATIVE mg/dL
Ketones, ur: NEGATIVE mg/dL
Leukocytes, UA: NEGATIVE
Nitrite: NEGATIVE
Protein, ur: NEGATIVE mg/dL
Specific Gravity, Urine: 1.025 (ref 1.005–1.030)
Urobilinogen, UA: 1 mg/dL (ref 0.0–1.0)
pH: 7 (ref 5.0–8.0)

## 2013-10-05 LAB — URINE MICROSCOPIC-ADD ON

## 2013-10-05 LAB — CBC
HCT: 39.7 % (ref 36.0–46.0)
Hemoglobin: 14 g/dL (ref 12.0–15.0)
MCH: 30 pg (ref 26.0–34.0)
MCHC: 35.3 g/dL (ref 30.0–36.0)
MCV: 85.2 fL (ref 78.0–100.0)
Platelets: 408 10*3/uL — ABNORMAL HIGH (ref 150–400)
RBC: 4.66 MIL/uL (ref 3.87–5.11)
RDW: 13.2 % (ref 11.5–15.5)
WBC: 8 10*3/uL (ref 4.0–10.5)

## 2013-10-05 LAB — HCG, QUANTITATIVE, PREGNANCY: hCG, Beta Chain, Quant, S: 5 m[IU]/mL — ABNORMAL HIGH (ref <5)

## 2013-10-05 LAB — BASIC METABOLIC PANEL
BUN: 10 mg/dL (ref 6–23)
CO2: 25 mEq/L (ref 19–32)
Calcium: 9.3 mg/dL (ref 8.4–10.5)
Chloride: 102 mEq/L (ref 96–112)
Creatinine, Ser: 1.04 mg/dL (ref 0.50–1.10)
GFR calc Af Amer: 80 mL/min — ABNORMAL LOW (ref >90)
GFR calc non Af Amer: 69 mL/min — ABNORMAL LOW (ref >90)
Glucose, Bld: 95 mg/dL (ref 70–99)
Potassium: 3.8 mEq/L (ref 3.5–5.1)
Sodium: 139 mEq/L (ref 135–145)

## 2013-10-05 LAB — ABO/RH: ABO/RH(D): B POS

## 2013-10-05 MED ORDER — OXYCODONE-ACETAMINOPHEN 5-325 MG PO TABS
2.0000 | ORAL_TABLET | Freq: Once | ORAL | Status: AC
  Administered 2013-10-06: 2 via ORAL
  Filled 2013-10-05: qty 2

## 2013-10-05 MED ORDER — MORPHINE SULFATE 4 MG/ML IJ SOLN
4.0000 mg | Freq: Once | INTRAMUSCULAR | Status: AC
  Administered 2013-10-05: 4 mg via INTRAMUSCULAR
  Filled 2013-10-05: qty 1

## 2013-10-05 MED ORDER — OXYCODONE-ACETAMINOPHEN 5-325 MG PO TABS: 2.0000 | ORAL_TABLET | ORAL | Status: DC | PRN

## 2013-10-05 NOTE — ED Notes (Signed)
MD at bedside. 

## 2013-10-05 NOTE — ED Notes (Signed)
Patient transported to Ultrasound 

## 2013-10-05 NOTE — ED Notes (Signed)
Pt had IVF on 10/28 and this resulted in a pregnancy.  Pt had [place] which showed that a uterine pregnancy had resulted.  Pt was doing well until last Thursday when she began bleeding (as heavy as a period).  Pt was seen today by her GYN (Dr. Alvester Chou in [facility]) and had a transvaginal US which didn't show any products of conception in her uterus.  Pt states that she continues to have left lower back pain which she describes as throbbing and sharp.  Pt denies any excessive bleeding but states that she did feel something come out of her as she was getting out of the car.  Pt states that she is here due to pain (and she expresses fear that it could be a ectopic pregnancy).

## 2013-10-05 NOTE — ED Notes (Signed)
Introduced self to patient.  C/O low back pain that radiates to left side that worsened at approx 1600 today.  Had IVF, went to doc today to "hear heartbeat".   MD noted fetus no longer en utero.  Pt has had bleeding x 3 days, had lessened but now started again.  Back pain gradually increasing at same time.

## 2013-10-05 NOTE — ED Provider Notes (Signed)
CSN: [id number]     Arrival date & time 10/05/13  1825 History   First MD Initiated Contact with Patient 10/05/13 2011     Chief Complaint  Patient presents with  . Miscarriage   (Consider location/radiation/quality/duration/timing/severity/associated sxs/prior Treatment) HPI Comments: Patient is a 32 year old female G1 who presents with complaints of left sided abdominal pain cramping and vaginal bleeding. She states that she underwent in vitro fertilization and resulted in a documented intrauterine pregnancy several weeks ago. She started having spotting 3 or 4 days ago which is worsened and she is now having cramping. She tells me she was seen today at her OB and had an ultrasound performed. This apparently showed findings that were concerning for a miscarriage. No fetal sac or heart tones could be identified. She presents here tonight with increased pain in her left side concerned that she may have an ectopic pregnancy. She denies fevers or chills. She denies lightheadedness or dizziness. She denies any urinary complaints.  The history is provided by the patient.    History reviewed. No pertinent past medical history. Past Surgical History  Procedure Laterality Date  . Appendectomy    . Tubal ligation    . Oophorectomy    . Ovarian cyst surgery     No family history on file. History  Substance Use Topics  . Smoking status: Never Smoker   . Smokeless tobacco: Not on file  . Alcohol Use: No   OB History   Grav Para Term Preterm Abortions TAB SAB Ect Mult Living                 Review of Systems  All other systems reviewed and are negative.    Allergies  Codeine; Hydrocodone; and Penicillins  Home Medications   Current Outpatient Rx  Name  Route  Sig  Dispense  Refill  . aspirin 81 MG tablet   Oral   Take 81 mg by mouth daily.         Marland Kitchen estradiol (ESTRACE) 1 MG tablet   Oral   Take 1 mg by mouth 2 (two) times daily.         Marland Kitchen estradiol (VIVELLE-DOT) 0.1  MG/24HR patch   Transdermal   Place 1 patch onto the skin every 3 (three) days.         . Prenatal Vit-Fe Fumarate-FA (PRENATAL MULTIVITAMIN) TABS tablet   Oral   Take 1 tablet by mouth daily at 12 noon.         . progesterone (ENDOMETRIN) 100 MG vaginal insert   Vaginal   Place 100 mg vaginally 2 (two) times daily.         . progesterone (PROMETRIUM) 200 MG capsule   Oral   Take 400 mg by mouth at bedtime.          BP 145/85  Pulse 109  Temp(Src) 98 F (36.7 C) (Oral)  Resp 18  SpO2 97%  LMP 08/19/2013 Physical Exam  Nursing note and vitals reviewed. Constitutional: She is oriented to person, place, and time. She appears well-developed and well-nourished. No distress.  HENT:  Head: Normocephalic and atraumatic.  Neck: Normal range of motion. Neck supple.  Cardiovascular: Normal rate and regular rhythm.  Exam reveals no gallop and no friction rub.   No murmur heard. Pulmonary/Chest: Effort normal and breath sounds normal. No respiratory distress. She has no wheezes.  Abdominal: Soft. Bowel sounds are normal. She exhibits no distension and no mass. There is tenderness.  There is  tenderness to palpation in the left flank and left lower quadrant. There is no rebound and no guarding.  Musculoskeletal: Normal range of motion.  Neurological: She is alert and oriented to person, place, and time.  Skin: Skin is warm and dry. She is not diaphoretic.    ED Course  Procedures (including critical care time) Labs Review Labs Reviewed  CBC - Abnormal; Notable for the following:    Platelets 408 (*)    All other components within normal limits  BASIC METABOLIC PANEL - Abnormal; Notable for the following:    GFR calc non Af Amer 69 (*)    GFR calc Af Amer 80 (*)    All other components within normal limits  HCG, QUANTITATIVE, PREGNANCY  URINALYSIS, ROUTINE W REFLEX MICROSCOPIC  ABO/RH   Imaging Review No results found.    MDM  No diagnosis found. Patient presents  here with pain in the left lower quadrant. Workup today reveals that she has miscarried the pregnancy which was implanted by in vitro fertilization. Her beta-hCG is 5 and her uterus is empty. She has an 8 cm cyst in the left adnexa on the ultrasound and I suspect this is what is giving her her discomfort. She will be treated with pain medication and I have advised her to followup with her OB GYN at [facility] to discuss further management. Her blood type is B. positive, therefore she does not require RhoGAM. She will be prescribed Percocet and advised to followup. She is to return if her symptoms worsen or change.    Geoffery Lyons, MD 10/05/13 [phone]

## 2013-10-06 MED ORDER — ONDANSETRON 4 MG PO TBDP
4.0000 mg | ORAL_TABLET | Freq: Once | ORAL | Status: AC
  Administered 2013-10-06: 4 mg via ORAL
  Filled 2013-10-06: qty 1

## 2014-04-29 ENCOUNTER — Other Ambulatory Visit: Payer: Self-pay | Admitting: Sports Medicine

## 2014-04-29 DIAGNOSIS — M25562 Pain in left knee: Secondary | ICD-10-CM

## 2014-05-08 ENCOUNTER — Ambulatory Visit
Admission: RE | Admit: 2014-05-08 | Discharge: 2014-05-08 | Disposition: A | Discharge status: Home / Self Care | Payer: BC Managed Care – PPO | Source: Ambulatory Visit | Attending: Sports Medicine | Admitting: Sports Medicine

## 2014-05-08 DIAGNOSIS — M25562 Pain in left knee: Secondary | ICD-10-CM

## 2014-12-29 ENCOUNTER — Encounter (HOSPITAL_COMMUNITY): Payer: Self-pay | Admitting: *Deleted

## 2014-12-29 ENCOUNTER — Emergency Department (INDEPENDENT_AMBULATORY_CARE_PROVIDER_SITE_OTHER)
Admission: EM | Admit: 2014-12-29 | Discharge: 2014-12-29 | Disposition: A | Discharge status: Home / Self Care | Payer: BLUE CROSS/BLUE SHIELD | Source: Home / Self Care | Attending: Family Medicine | Admitting: Family Medicine

## 2014-12-29 DIAGNOSIS — J40 Bronchitis, not specified as acute or chronic: Secondary | ICD-10-CM

## 2014-12-29 DIAGNOSIS — J0101 Acute recurrent maxillary sinusitis: Secondary | ICD-10-CM

## 2014-12-29 MED ORDER — IPRATROPIUM-ALBUTEROL 0.5-2.5 (3) MG/3ML IN SOLN
3.0000 mL | Freq: Once | RESPIRATORY_TRACT | Status: AC
  Administered 2014-12-29: 3 mL via RESPIRATORY_TRACT

## 2014-12-29 MED ORDER — ALBUTEROL SULFATE HFA 108 (90 BASE) MCG/ACT IN AERS: 8.0000 | INHALATION_SPRAY | Freq: Once | RESPIRATORY_TRACT | Status: DC

## 2014-12-29 MED ORDER — ALBUTEROL SULFATE HFA 108 (90 BASE) MCG/ACT IN AERS: 2.0000 | INHALATION_SPRAY | Freq: Four times a day (QID) | RESPIRATORY_TRACT | Status: DC | PRN

## 2014-12-29 MED ORDER — PREDNISONE 10 MG PO TABS: 30.0000 mg | ORAL_TABLET | Freq: Every day | ORAL | Status: DC

## 2014-12-29 MED ORDER — AEROCHAMBER PLUS W/MASK MISC
Status: AC
  Filled 2014-12-29: qty 1

## 2014-12-29 MED ORDER — IPRATROPIUM-ALBUTEROL 0.5-2.5 (3) MG/3ML IN SOLN
RESPIRATORY_TRACT | Status: AC
  Filled 2014-12-29: qty 3

## 2014-12-29 MED ORDER — ALBUTEROL SULFATE HFA 108 (90 BASE) MCG/ACT IN AERS
INHALATION_SPRAY | RESPIRATORY_TRACT | Status: AC
  Filled 2014-12-29: qty 6.7

## 2014-12-29 MED ORDER — IPRATROPIUM BROMIDE 0.06 % NA SOLN: 2.0000 | Freq: Four times a day (QID) | NASAL | Status: DC

## 2014-12-29 MED ORDER — AEROCHAMBER PLUS FLO-VU LARGE MISC: 1.0000 | Freq: Once | Status: DC

## 2014-12-29 NOTE — Discharge Instructions (Signed)
Thank you for coming in today. °Call or go to the emergency room if you get worse, have trouble breathing, have chest pains, or palpitations.  ° °Acute Bronchitis °Bronchitis is inflammation of the airways that extend from the windpipe into the lungs (bronchi). The inflammation often causes mucus to develop. This leads to a cough, which is the most common symptom of bronchitis.  °In acute bronchitis, the condition usually develops suddenly and goes away over time, usually in a couple weeks. Smoking, allergies, and asthma can make bronchitis worse. Repeated episodes of bronchitis may cause further lung problems.  °CAUSES °Acute bronchitis is most often caused by the same virus that causes a cold. The virus can spread from person to person (contagious) through coughing, sneezing, and touching contaminated objects. °SIGNS AND SYMPTOMS  °· Cough.   °· Fever.   °· Coughing up mucus.   °· Body aches.   °· Chest congestion.   °· Chills.   °· Shortness of breath.   °· Sore throat.   °DIAGNOSIS  °Acute bronchitis is usually diagnosed through a physical exam. Your health care provider will also ask you questions about your medical history. Tests, such as chest X-rays, are sometimes done to rule out other conditions.  °TREATMENT  °Acute bronchitis usually goes away in a couple weeks. Oftentimes, no medical treatment is necessary. Medicines are sometimes given for relief of fever or cough. Antibiotic medicines are usually not needed but may be prescribed in certain situations. In some cases, an inhaler may be recommended to help reduce shortness of breath and control the cough. A cool mist vaporizer may also be used to help thin bronchial secretions and make it easier to clear the chest.  °HOME CARE INSTRUCTIONS °· Get plenty of rest.   °· Drink enough fluids to keep your urine clear or pale yellow (unless you have a medical condition that requires fluid restriction). Increasing fluids may help thin your respiratory secretions  (sputum) and reduce chest congestion, and it will prevent dehydration.   °· Take medicines only as directed by your health care provider. °· If you were prescribed an antibiotic medicine, finish it all even if you start to feel better. °· Avoid smoking and secondhand smoke. Exposure to cigarette smoke or irritating chemicals will make bronchitis worse. If you are a smoker, consider using nicotine gum or skin patches to help control withdrawal symptoms. Quitting smoking will help your lungs heal faster.   °· Reduce the chances of another bout of acute bronchitis by washing your hands frequently, avoiding people with cold symptoms, and trying not to touch your hands to your mouth, nose, or eyes.   °· Keep all follow-up visits as directed by your health care provider.   °SEEK MEDICAL CARE IF: °Your symptoms do not improve after 1 week of treatment.  °SEEK IMMEDIATE MEDICAL CARE IF: °· You develop an increased fever or chills.   °· You have chest pain.   °· You have severe shortness of breath. °· You have bloody sputum.   °· You develop dehydration. °· You faint or repeatedly feel like you are going to pass out. °· You develop repeated vomiting. °· You develop a severe headache. °MAKE SURE YOU:  °· Understand these instructions. °· Will watch your condition. °· Will get help right away if you are not doing well or get worse. °Document Released: 12/05/2004 Document Revised: 03/14/2014 Document Reviewed: 04/20/2013 °[employer]® Patient Information ©2015 [employer]. This information is not intended to replace advice given to you by your health care provider. Make sure you discuss any questions you have with your   health care provider.   Sinusitis Sinusitis is redness, soreness, and inflammation of the paranasal sinuses. Paranasal sinuses are air pockets within the bones of your face (beneath the eyes, the middle of the forehead, or above the eyes). In healthy paranasal sinuses, mucus is able to drain out, and air is  able to circulate through them by way of your nose. However, when your paranasal sinuses are inflamed, mucus and air can become trapped. This can allow bacteria and other germs to grow and cause infection. Sinusitis can develop quickly and last only a short time (acute) or continue over a long period (chronic). Sinusitis that lasts for more than 12 weeks is considered chronic.  CAUSES  Causes of sinusitis include:  Allergies.  Structural abnormalities, such as displacement of the cartilage that separates your nostrils (deviated septum), which can decrease the air flow through your nose and sinuses and affect sinus drainage.  Functional abnormalities, such as when the small hairs (cilia) that line your sinuses and help remove mucus do not work properly or are not present. SIGNS AND SYMPTOMS  Symptoms of acute and chronic sinusitis are the same. The primary symptoms are pain and pressure around the affected sinuses. Other symptoms include:  Upper toothache.  Earache.  Headache.  Bad breath.  Decreased sense of smell and taste.  A cough, which worsens when you are lying flat.  Fatigue.  Fever.  Thick drainage from your nose, which often is green and may contain pus (purulent).  Swelling and warmth over the affected sinuses. DIAGNOSIS  Your health care provider will perform a physical exam. During the exam, your health care provider may:  Look in your nose for signs of abnormal growths in your nostrils (nasal polyps).  Tap over the affected sinus to check for signs of infection.  View the inside of your sinuses (endoscopy) using an imaging device that has a light attached (endoscope). If your health care provider suspects that you have chronic sinusitis, one or more of the following tests may be recommended:  Allergy tests.  Nasal culture. A sample of mucus is taken from your nose, sent to a lab, and screened for bacteria.  Nasal cytology. A sample of mucus is taken from  your nose and examined by your health care provider to determine if your sinusitis is related to an allergy. TREATMENT  Most cases of acute sinusitis are related to a viral infection and will resolve on their own within 10 days. Sometimes medicines are prescribed to help relieve symptoms (pain medicine, decongestants, nasal steroid sprays, or saline sprays).  However, for sinusitis related to a bacterial infection, your health care provider will prescribe antibiotic medicines. These are medicines that will help kill the bacteria causing the infection.  Rarely, sinusitis is caused by a fungal infection. In theses cases, your health care provider will prescribe antifungal medicine. For some cases of chronic sinusitis, surgery is needed. Generally, these are cases in which sinusitis recurs more than 3 times per year, despite other treatments. HOME CARE INSTRUCTIONS   Drink plenty of water. Water helps thin the mucus so your sinuses can drain more easily.  Use a humidifier.  Inhale steam 3 to 4 times a day (for example, sit in the bathroom with the shower running).  Apply a warm, moist washcloth to your face 3 to 4 times a day, or as directed by your health care provider.  Use saline nasal sprays to help moisten and clean your sinuses.  Take medicines only as directed  by your health care provider.  If you were prescribed either an antibiotic or antifungal medicine, finish it all even if you start to feel better. SEEK IMMEDIATE MEDICAL CARE IF:  You have increasing pain or severe headaches.  You have nausea, vomiting, or drowsiness.  You have swelling around your face.  You have vision problems.  You have a stiff neck.  You have difficulty breathing. MAKE SURE YOU:   Understand these instructions.  Will watch your condition.  Will get help right away if you are not doing well or get worse. Document Released: 10/28/2005 Document Revised: 03/14/2014 Document Reviewed:  11/12/2011 [facility] Patient Information 2015 [place], [place]. This information is not intended to replace advice given to you by your health care provider. Make sure you discuss any questions you have with your health care provider.

## 2014-12-29 NOTE — ED Notes (Signed)
C/o congestion in her nose and chest andsevere headache onset Monday.  Had the stomach flu 2 weeks ago. C/o chills but has not noted a fever.  Cough is occ. prod of green sputum in the morning.  No earache but neck is sore.

## 2014-12-29 NOTE — ED Provider Notes (Signed)
[name] is a 34 y.o. female who presents to Urgent Care today for Nasal congestion and chest congestion and facial pain and pressure associated with some wheezing and shortness of breath present for the last few days. Patient denies any current vomiting or diarrhea but noted some about a week and a half ago. She has tried some over-the-counter medications which have helped a little. She denies any significant chest pain or abdominal pain. No night sweats.   No past medical history on file. Past Surgical History  Procedure Laterality Date  . Appendectomy    . Tubal ligation    . Oophorectomy    . Ovarian cyst surgery     History  Substance Use Topics  . Smoking status: Never Smoker   . Smokeless tobacco: Not on file  . Alcohol Use: No   ROS as above Medications: No current facility-administered medications for this encounter.   Current Outpatient Prescriptions  Medication Sig Dispense Refill  . aspirin 81 MG tablet Take 81 mg by mouth daily.    Marland Kitchen estradiol (ESTRACE) 1 MG tablet Take 1 mg by mouth 2 (two) times daily.    Marland Kitchen estradiol (VIVELLE-DOT) 0.1 MG/24HR patch Place 1 patch onto the skin every 3 (three) days.    Marland Kitchen oxyCODONE-acetaminophen (PERCOCET) 5-325 MG per tablet Take 2 tablets by mouth every 4 (four) hours as needed. 15 tablet 0  . Prenatal Vit-Fe Fumarate-FA (PRENATAL MULTIVITAMIN) TABS tablet Take 1 tablet by mouth daily at 12 noon.    . progesterone (ENDOMETRIN) 100 MG vaginal insert Place 100 mg vaginally 2 (two) times daily.    . progesterone (PROMETRIUM) 200 MG capsule Take 400 mg by mouth at bedtime.     Allergies  Allergen Reactions  . Codeine     Hives and itching  . Hydrocodone Hives and Itching  . Penicillins     Hives and itching     Exam:  BP 129/89 mmHg  Pulse 90  Temp(Src) 99.4 F (37.4 C) (Oral)  Resp 18  SpO2 99% Gen: Well NAD HEENT: EOMI,  MMM clear nasal discharge and inflamed nasal turbinates bilaterally. Normal tympanic membranes  bilaterally. Posterior pharynx with cobblestoning. Lungs: Normal work of breathing. CTABL Heart: RRR no MRG Abd: NABS, Soft. Nondistended, Nontender Exts: Brisk capillary refill, warm and well perfused.    No results found for this or any previous visit (from the past 24 hour(s)). No results found.  Assessment and Plan: 34 y.o. female with bronchitis and sinusitis likely viral. Plan to treat with prednisone and Atrovent nasal spray and albuterol. Work note provided. Return as needed.  Discussed warning signs or symptoms. Please see discharge instructions. Patient expresses understanding.     Gregor Hams, MD 12/29/14 [phone]

## 2015-02-26 ENCOUNTER — Emergency Department (HOSPITAL_BASED_OUTPATIENT_CLINIC_OR_DEPARTMENT_OTHER)
Admission: EM | Admit: 2015-02-26 | Discharge: 2015-02-26 | Disposition: A | Discharge status: Home / Self Care | Payer: BLUE CROSS/BLUE SHIELD | Attending: Emergency Medicine | Admitting: Emergency Medicine

## 2015-02-26 ENCOUNTER — Emergency Department (HOSPITAL_BASED_OUTPATIENT_CLINIC_OR_DEPARTMENT_OTHER): Payer: BLUE CROSS/BLUE SHIELD

## 2015-02-26 DIAGNOSIS — Z880 Allergy status to penicillin: Secondary | ICD-10-CM | POA: Insufficient documentation

## 2015-02-26 DIAGNOSIS — N390 Urinary tract infection, site not specified: Secondary | ICD-10-CM | POA: Insufficient documentation

## 2015-02-26 DIAGNOSIS — M5441 Lumbago with sciatica, right side: Secondary | ICD-10-CM

## 2015-02-26 DIAGNOSIS — Z3202 Encounter for pregnancy test, result negative: Secondary | ICD-10-CM | POA: Diagnosis not present

## 2015-02-26 DIAGNOSIS — M545 Low back pain: Secondary | ICD-10-CM | POA: Diagnosis present

## 2015-02-26 LAB — URINALYSIS, ROUTINE W REFLEX MICROSCOPIC
BILIRUBIN URINE: NEGATIVE
Glucose, UA: NEGATIVE mg/dL
KETONES UR: NEGATIVE mg/dL
Nitrite: NEGATIVE
Protein, ur: NEGATIVE mg/dL
Specific Gravity, Urine: 1.016 (ref 1.005–1.030)
UROBILINOGEN UA: 1 mg/dL (ref 0.0–1.0)
pH: 6 (ref 5.0–8.0)

## 2015-02-26 LAB — URINE MICROSCOPIC-ADD ON

## 2015-02-26 LAB — PREGNANCY, URINE: PREG TEST UR: NEGATIVE

## 2015-02-26 MED ORDER — IBUPROFEN 800 MG PO TABS
800.0000 mg | ORAL_TABLET | Freq: Once | ORAL | Status: AC
  Administered 2015-02-26: 800 mg via ORAL
  Filled 2015-02-26: qty 1

## 2015-02-26 MED ORDER — FLUCONAZOLE 150 MG PO TABS: 150.0000 mg | ORAL_TABLET | Freq: Once | ORAL | Status: DC

## 2015-02-26 MED ORDER — DIPHENHYDRAMINE HCL 25 MG PO CAPS
25.0000 mg | ORAL_CAPSULE | Freq: Once | ORAL | Status: AC
  Administered 2015-02-26: 25 mg via ORAL
  Filled 2015-02-26: qty 1

## 2015-02-26 MED ORDER — CIPROFLOXACIN HCL 500 MG PO TABS
500.0000 mg | ORAL_TABLET | Freq: Once | ORAL | Status: AC
  Administered 2015-02-26: 500 mg via ORAL
  Filled 2015-02-26: qty 1

## 2015-02-26 MED ORDER — OXYCODONE-ACETAMINOPHEN 5-325 MG PO TABS
1.0000 | ORAL_TABLET | Freq: Once | ORAL | Status: AC
  Administered 2015-02-26: 1 via ORAL
  Filled 2015-02-26: qty 1

## 2015-02-26 MED ORDER — HYDROMORPHONE HCL 1 MG/ML IJ SOLN
1.0000 mg | Freq: Once | INTRAMUSCULAR | Status: AC
Start: 2015-02-26 — End: 2015-02-26
  Administered 2015-02-26: 1 mg via INTRAMUSCULAR
  Filled 2015-02-26: qty 1

## 2015-02-26 MED ORDER — HYDROMORPHONE HCL 1 MG/ML IJ SOLN
1.0000 mg | Freq: Once | INTRAMUSCULAR | Status: AC
  Administered 2015-02-26: 1 mg via INTRAMUSCULAR
  Filled 2015-02-26: qty 1

## 2015-02-26 MED ORDER — CIPROFLOXACIN HCL 500 MG PO TABS: 500.0000 mg | ORAL_TABLET | Freq: Two times a day (BID) | ORAL | Status: DC

## 2015-02-26 MED ORDER — OXYCODONE-ACETAMINOPHEN 5-325 MG PO TABS: 1.0000 | ORAL_TABLET | Freq: Four times a day (QID) | ORAL | Status: DC | PRN

## 2015-02-26 NOTE — ED Notes (Signed)
Presents w/ a 2 week hx of lower back pain, progressively worse, radiates to Rt hip down Rt leg, pt states slept on floor w/ heating pad last pm

## 2015-02-26 NOTE — ED Notes (Signed)
Denies any injury or falls recently

## 2015-02-26 NOTE — ED Notes (Signed)
Pt states no relief from pain med prev administered per EDP orders, facial grimmacing and some restlessness noted on stretcher, pt attempting to assume position of comfort, ED provider notified of pts response to meds

## 2015-02-26 NOTE — ED Notes (Signed)
Patient here with lower back pain x 2 weeks, using otc meds with no relief. Denies any known injury

## 2015-02-26 NOTE — ED Notes (Signed)
Appears to be resting more quietly and comfortably.

## 2015-02-26 NOTE — ED Provider Notes (Signed)
CSN: [id number]     Arrival date & time 02/26/15  1015 History   First MD Initiated Contact with Patient 02/26/15 1113     Chief Complaint  Patient presents with  . Back Pain     (Consider location/radiation/quality/duration/timing/severity/associated sxs/prior Treatment) Patient is a 34 y.o. female presenting with back pain. The history is provided by the patient.  Back Pain Location:  Lumbar spine Quality:  Aching and stabbing Radiates to:  R thigh Pain severity:  Moderate Pain is:  Same all the time Onset quality:  Gradual Duration:  2 weeks Timing: Initially intermittent now becoming constant. Progression:  Worsening Chronicity:  New Context comment:  Spontaneously. Relieved by:  NSAIDs Worsened by:  Nothing tried Ineffective treatments: muscle relaxants. Associated symptoms: no abdominal pain, no chest pain, no dysuria, no fever and no headaches     No past medical history on file. Past Surgical History  Procedure Laterality Date  . Tubal ligation  2005  . Oophorectomy Left 2007  . Ovarian cyst surgery  2013  . Appendectomy  1984   Family History  Problem Relation Age of Onset  . Hypertension Mother   . Hypertension Father    History  Substance Use Topics  . Smoking status: Never Smoker   . Smokeless tobacco: Not on file  . Alcohol Use: Yes     Comment: occasional   OB History    No data available     Review of Systems  Constitutional: Negative for fever and fatigue.  HENT: Negative for congestion and drooling.   Eyes: Negative for pain.  Respiratory: Negative for cough and shortness of breath.   Cardiovascular: Negative for chest pain.  Gastrointestinal: Negative for nausea, vomiting, abdominal pain and diarrhea.  Genitourinary: Negative for dysuria and hematuria.  Musculoskeletal: Positive for back pain. Negative for gait problem and neck pain.  Skin: Negative for color change.  Neurological: Negative for dizziness and headaches.  Hematological:  Negative for adenopathy.  Psychiatric/Behavioral: Negative for behavioral problems.  All other systems reviewed and are negative.     Allergies  Codeine; Hydrocodone; and Penicillins  Home Medications   Prior to Admission medications   Not on File   BP 130/81 mmHg  Pulse 91  Temp(Src) 98.3 F (36.8 C) (Oral)  Resp 18  Ht 5\' 5"  (1.651 m)  Wt 195 lb (88.451 kg)  BMI 32.45 kg/m2  SpO2 99% Physical Exam  Constitutional: She is oriented to person, place, and time. She appears well-developed and well-nourished.  HENT:  Head: Normocephalic.  Mouth/Throat: Oropharynx is clear and moist. No oropharyngeal exudate.  Eyes: Conjunctivae and EOM are normal. Pupils are equal, round, and reactive to light.  Neck: Normal range of motion. Neck supple.  Cardiovascular: Normal rate, regular rhythm, normal heart sounds and intact distal pulses.  Exam reveals no gallop and no friction rub.   No murmur heard. Pulmonary/Chest: Effort normal and breath sounds normal. No respiratory distress. She has no wheezes.  Abdominal: Soft. Bowel sounds are normal. There is no tenderness. There is no rebound and no guarding.  Musculoskeletal: Normal range of motion. She exhibits no edema or tenderness.  Tenderness to palpation of the mid lumbar area and also paralumbar area. Also mild tenderness to palpation of the right upper buttock.  Normal strength and sensation in bilateral lower extremities.  Neurological: She is alert and oriented to person, place, and time.  Reflex Scores:      Patellar reflexes are 2+ on the right side and 2+  on the left side.      Achilles reflexes are 2+ on the right side and 2+ on the left side. Skin: Skin is warm and dry.  Psychiatric: She has a normal mood and affect. Her behavior is normal.  Nursing note and vitals reviewed.   ED Course  Procedures (including critical care time) Labs Review Labs Reviewed  URINALYSIS, ROUTINE W REFLEX MICROSCOPIC - Abnormal; Notable for  the following:    APPearance CLOUDY (*)    Hgb urine dipstick LARGE (*)    Leukocytes, UA SMALL (*)    All other components within normal limits  URINE MICROSCOPIC-ADD ON - Abnormal; Notable for the following:    Squamous Epithelial / LPF MANY (*)    Bacteria, UA MANY (*)    All other components within normal limits  PREGNANCY, URINE    Imaging Review Dg Lumbar Spine Complete  02/26/2015   CLINICAL DATA:  Pt states she has had lower back pain x 2 weeks. Denies injury. Pain radiates down right leg  EXAM: LUMBAR SPINE - COMPLETE 4+ VIEW  COMPARISON:  08/12/2012  FINDINGS: There is no evidence of lumbar spine fracture. Alignment is normal. Intervertebral disc spaces are maintained.  IMPRESSION: Negative.   Electronically Signed   By: Lajean Manes M.D.   On: 02/26/2015 12:09     EKG Interpretation None      MDM   Final diagnoses:  UTI (lower urinary tract infection)  Low back pain with right-sided sciatica, unspecified back pain laterality    11:54 AM 34 y.o. female here with back pain radiating down her right anterior thigh for the last 2 weeks. Denies any fevers, weakness, numbness, bowel bladder incontinence. No other back pain red flags. Denies any specific injury. We'll get screening urinalysis, plain film, and pain control. Pain seems worsened by sitting up for long periods of time or bending. I suspect this is musculoskeletal.   3:11 PM: Sx sound like sciatica w/ radiation down right leg, Pt also has UTI w/ possible early pyelo. Tolerating oral meds here.  I have discussed the diagnosis/risks/treatment options with the patient and family and believe the pt to be eligible for discharge home to follow-up with her pcp as needed. We also discussed returning to the ED immediately if new or worsening sx occur. We discussed the sx which are most concerning (e.g., worsening pain, inability to take abx, fever) that necessitate immediate return. Medications administered to the patient during  their visit and any new prescriptions provided to the patient are listed below.  Medications given during this visit Medications  HYDROmorphone (DILAUDID) injection 1 mg (1 mg Intramuscular Given 02/26/15 1206)  ibuprofen (ADVIL,MOTRIN) tablet 800 mg (800 mg Oral Given 02/26/15 1206)  ciprofloxacin (CIPRO) tablet 500 mg (500 mg Oral Given 02/26/15 1326)  oxyCODONE-acetaminophen (PERCOCET/ROXICET) 5-325 MG per tablet 1 tablet (1 tablet Oral Given 02/26/15 1327)  diphenhydrAMINE (BENADRYL) capsule 25 mg (25 mg Oral Given 02/26/15 1327)  HYDROmorphone (DILAUDID) injection 1 mg (1 mg Intramuscular Given 02/26/15 1431)    New Prescriptions   CIPROFLOXACIN (CIPRO) 500 MG TABLET    Take 1 tablet (500 mg total) by mouth 2 (two) times daily. One po bid x 7 days   OXYCODONE-ACETAMINOPHEN (PERCOCET) 5-325 MG PER TABLET    Take 1-2 tablets by mouth every 6 (six) hours as needed.     Pamella Pert, MD 02/26/15 [phone]

## 2015-02-26 NOTE — ED Notes (Signed)
Patient transported to X-ray via stretcher per tech. 

## 2015-02-27 ENCOUNTER — Telehealth (HOSPITAL_BASED_OUTPATIENT_CLINIC_OR_DEPARTMENT_OTHER): Payer: Self-pay | Admitting: Emergency Medicine

## 2015-02-27 LAB — URINE CULTURE

## 2015-02-27 MED ORDER — ONDANSETRON HCL 4 MG PO TABS: 4.0000 mg | ORAL_TABLET | Freq: Four times a day (QID) | ORAL | Status: DC | PRN

## 2015-03-31 ENCOUNTER — Emergency Department (HOSPITAL_BASED_OUTPATIENT_CLINIC_OR_DEPARTMENT_OTHER)
Admission: EM | Admit: 2015-03-31 | Discharge: 2015-03-31 | Disposition: A | Discharge status: Home / Self Care | Payer: BLUE CROSS/BLUE SHIELD | Attending: Emergency Medicine | Admitting: Emergency Medicine

## 2015-03-31 ENCOUNTER — Emergency Department (HOSPITAL_BASED_OUTPATIENT_CLINIC_OR_DEPARTMENT_OTHER): Payer: BLUE CROSS/BLUE SHIELD

## 2015-03-31 ENCOUNTER — Encounter (HOSPITAL_BASED_OUTPATIENT_CLINIC_OR_DEPARTMENT_OTHER): Payer: Self-pay

## 2015-03-31 DIAGNOSIS — R110 Nausea: Secondary | ICD-10-CM | POA: Diagnosis not present

## 2015-03-31 DIAGNOSIS — R1084 Generalized abdominal pain: Secondary | ICD-10-CM | POA: Diagnosis not present

## 2015-03-31 DIAGNOSIS — Z3202 Encounter for pregnancy test, result negative: Secondary | ICD-10-CM | POA: Diagnosis not present

## 2015-03-31 DIAGNOSIS — R197 Diarrhea, unspecified: Secondary | ICD-10-CM | POA: Insufficient documentation

## 2015-03-31 DIAGNOSIS — Z880 Allergy status to penicillin: Secondary | ICD-10-CM | POA: Diagnosis not present

## 2015-03-31 DIAGNOSIS — Z9049 Acquired absence of other specified parts of digestive tract: Secondary | ICD-10-CM | POA: Insufficient documentation

## 2015-03-31 DIAGNOSIS — R1013 Epigastric pain: Secondary | ICD-10-CM | POA: Diagnosis present

## 2015-03-31 DIAGNOSIS — Z9861 Coronary angioplasty status: Secondary | ICD-10-CM | POA: Insufficient documentation

## 2015-03-31 DIAGNOSIS — R1011 Right upper quadrant pain: Secondary | ICD-10-CM

## 2015-03-31 LAB — CBC WITH DIFFERENTIAL/PLATELET
Basophils Absolute: 0 10*3/uL (ref 0.0–0.1)
Basophils Relative: 0 % (ref 0–1)
EOS ABS: 0.1 10*3/uL (ref 0.0–0.7)
Eosinophils Relative: 1 % (ref 0–5)
HCT: 40.4 % (ref 36.0–46.0)
Hemoglobin: 13.6 g/dL (ref 12.0–15.0)
LYMPHS ABS: 1.6 10*3/uL (ref 0.7–4.0)
LYMPHS PCT: 26 % (ref 12–46)
MCH: 28.6 pg (ref 26.0–34.0)
MCHC: 33.7 g/dL (ref 30.0–36.0)
MCV: 84.9 fL (ref 78.0–100.0)
Monocytes Absolute: 0.5 10*3/uL (ref 0.1–1.0)
Monocytes Relative: 8 % (ref 3–12)
NEUTROS ABS: 4 10*3/uL (ref 1.7–7.7)
Neutrophils Relative %: 65 % (ref 43–77)
PLATELETS: 356 10*3/uL (ref 150–400)
RBC: 4.76 MIL/uL (ref 3.87–5.11)
RDW: 12.7 % (ref 11.5–15.5)
WBC: 6.1 10*3/uL (ref 4.0–10.5)

## 2015-03-31 LAB — COMPREHENSIVE METABOLIC PANEL
ALK PHOS: 61 U/L (ref 38–126)
ALT: 31 U/L (ref 14–54)
AST: 26 U/L (ref 15–41)
Albumin: 3.9 g/dL (ref 3.5–5.0)
Anion gap: 8 (ref 5–15)
BUN: 11 mg/dL (ref 6–20)
CO2: 27 mmol/L (ref 22–32)
Calcium: 9.3 mg/dL (ref 8.9–10.3)
Chloride: 105 mmol/L (ref 101–111)
Creatinine, Ser: 1.07 mg/dL — ABNORMAL HIGH (ref 0.44–1.00)
GFR calc Af Amer: >60 mL/min (ref >60)
GFR calc non Af Amer: >60 mL/min (ref >60)
Glucose, Bld: 92 mg/dL (ref 65–99)
Potassium: 3.6 mmol/L (ref 3.5–5.1)
SODIUM: 140 mmol/L (ref 135–145)
TOTAL PROTEIN: 7.6 g/dL (ref 6.5–8.1)
Total Bilirubin: 0.4 mg/dL (ref 0.3–1.2)

## 2015-03-31 LAB — URINALYSIS, ROUTINE W REFLEX MICROSCOPIC
Bilirubin Urine: NEGATIVE
Glucose, UA: NEGATIVE mg/dL
Ketones, ur: NEGATIVE mg/dL
Leukocytes, UA: NEGATIVE
NITRITE: NEGATIVE
Protein, ur: NEGATIVE mg/dL
Specific Gravity, Urine: 1.03 (ref 1.005–1.030)
UROBILINOGEN UA: 0.2 mg/dL (ref 0.0–1.0)
pH: 5.5 (ref 5.0–8.0)

## 2015-03-31 LAB — URINE MICROSCOPIC-ADD ON

## 2015-03-31 LAB — PREGNANCY, URINE: Preg Test, Ur: NEGATIVE

## 2015-03-31 LAB — LIPASE, BLOOD: Lipase: 30 U/L (ref 22–51)

## 2015-03-31 MED ORDER — OXYCODONE-ACETAMINOPHEN 5-325 MG PO TABS: 1.0000 | ORAL_TABLET | ORAL | Status: DC | PRN

## 2015-03-31 MED ORDER — HYDROMORPHONE HCL 1 MG/ML IJ SOLN
INTRAMUSCULAR | Status: AC
  Administered 2015-03-31: 1 mg via INTRAVENOUS
  Filled 2015-03-31: qty 1

## 2015-03-31 MED ORDER — SODIUM CHLORIDE 0.9 % IV BOLUS (SEPSIS)
1000.0000 mL | Freq: Once | INTRAVENOUS | Status: AC
  Administered 2015-03-31: 1000 mL via INTRAVENOUS

## 2015-03-31 MED ORDER — ONDANSETRON HCL 4 MG/2ML IJ SOLN
4.0000 mg | Freq: Once | INTRAMUSCULAR | Status: AC
  Administered 2015-03-31: 4 mg via INTRAVENOUS
  Filled 2015-03-31: qty 2

## 2015-03-31 MED ORDER — OXYCODONE-ACETAMINOPHEN 5-325 MG PO TABS
2.0000 | ORAL_TABLET | Freq: Once | ORAL | Status: AC
  Administered 2015-03-31: 2 via ORAL
  Filled 2015-03-31: qty 2

## 2015-03-31 MED ORDER — ONDANSETRON 4 MG PO TBDP: ORAL_TABLET | ORAL | Status: DC

## 2015-03-31 MED ORDER — HYDROMORPHONE HCL 1 MG/ML IJ SOLN
1.0000 mg | Freq: Once | INTRAMUSCULAR | Status: AC
  Administered 2015-03-31: 1 mg via INTRAVENOUS

## 2015-03-31 NOTE — ED Provider Notes (Signed)
CSN: [id number]     Arrival date & time 03/31/15  1059 History   First MD Initiated Contact with Patient 03/31/15 1101     Chief Complaint  Patient presents with  . Abdominal Pain     (Consider location/radiation/quality/duration/timing/severity/associated sxs/prior Treatment) Patient is a 34 y.o. female presenting with abdominal pain.  Abdominal Pain Pain location:  Epigastric Pain quality: cramping and sharp   Pain radiates to:  Does not radiate Onset quality:  Gradual Duration:  1 day Timing:  Intermittent Progression:  Worsening Chronicity:  New Context comment:  Recent UTI treatment Relieved by:  Nothing Worsened by:  Nothing tried Associated symptoms: diarrhea (48 hours) and nausea   Associated symptoms: no cough, no dysuria, no fever and no vomiting     History reviewed. No pertinent past medical history. Past Surgical History  Procedure Laterality Date  . Tubal ligation  2005  . Oophorectomy Left 2007  . Ovarian cyst surgery  2013  . Appendectomy  1984  . Salpingoophorectomy     Family History  Problem Relation Age of Onset  . Hypertension Mother   . Hypertension Father    History  Substance Use Topics  . Smoking status: Never Smoker   . Smokeless tobacco: Not on file  . Alcohol Use: No     Comment: denies   OB History    No data available     Review of Systems  Constitutional: Negative for fever.  Respiratory: Negative for cough.   Gastrointestinal: Positive for nausea, abdominal pain and diarrhea (48 hours). Negative for vomiting.  Genitourinary: Negative for dysuria.  All other systems reviewed and are negative.     Allergies  Codeine; Hydrocodone; and Penicillins  Home Medications   Prior to Admission medications   Medication Sig Start Date End Date Taking? Authorizing Provider  ondansetron (ZOFRAN ODT) 4 MG disintegrating tablet 4mg  ODT q4 hours prn nausea/vomit 03/31/15   Debby Freiberg, MD  oxyCODONE-acetaminophen (PERCOCET/ROXICET)  5-325 MG per tablet Take 1-2 tablets by mouth every 4 (four) hours as needed. 03/31/15   Debby Freiberg, MD   BP 122/74 mmHg  Pulse 86  Temp(Src) 99 F (37.2 C) (Oral)  Resp 18  Ht 5\' 5"  (1.651 m)  Wt 190 lb (86.183 kg)  BMI 31.62 kg/m2  SpO2 100%  LMP 03/24/2015 (Exact Date) Physical Exam  Constitutional: She is oriented to person, place, and time. She appears well-developed and well-nourished.  HENT:  Head: Normocephalic and atraumatic.  Right Ear: External ear normal.  Left Ear: External ear normal.  Eyes: Conjunctivae and EOM are normal. Pupils are equal, round, and reactive to light.  Neck: Normal range of motion. Neck supple.  Cardiovascular: Normal rate, regular rhythm, normal heart sounds and intact distal pulses.   Pulmonary/Chest: Effort normal and breath sounds normal.  Abdominal: Soft. Bowel sounds are normal. There is generalized tenderness.  Musculoskeletal: Normal range of motion.  Neurological: She is alert and oriented to person, place, and time.  Skin: Skin is warm and dry.  Vitals reviewed.   ED Course  Procedures (including critical care time) Labs Review Labs Reviewed  URINALYSIS, ROUTINE W REFLEX MICROSCOPIC - Abnormal; Notable for the following:    Hgb urine dipstick SMALL (*)    All other components within normal limits  COMPREHENSIVE METABOLIC PANEL - Abnormal; Notable for the following:    Creatinine, Ser 1.07 (*)    All other components within normal limits  URINE MICROSCOPIC-ADD ON - Abnormal; Notable for the following:  Squamous Epithelial / LPF FEW (*)    Bacteria, UA MANY (*)    All other components within normal limits  PREGNANCY, URINE  CBC WITH DIFFERENTIAL/PLATELET  LIPASE, BLOOD    Imaging Review No results found.   EKG Interpretation None      MDM   Final diagnoses:  Generalized abdominal pain  Diarrhea    34 y.o. female without pertinent PMH presents with crampy abd pain, diarrhea.  Exam as above on arrival.  Elba Barman  with mildly elevated cr, otherwise unremarkable.  US unremarkable, symptoms relieved with narcotics and GI cocktail.  DC home in stable condition, likely gastroenteritis.   I have reviewed all laboratory and imaging studies if ordered as above  1. Generalized abdominal pain   2. RUQ pain   3. Diarrhea         Debby Freiberg, MD 04/04/15 [phone]

## 2015-03-31 NOTE — ED Notes (Signed)
MD at bedside. 

## 2015-03-31 NOTE — ED Notes (Signed)
Diarrhea x 28 hours and developed severe abdominal pain today.  Recently treated for UTI.

## 2015-03-31 NOTE — Discharge Instructions (Signed)

## 2015-06-20 ENCOUNTER — Ambulatory Visit: Payer: BLUE CROSS/BLUE SHIELD | Admitting: Family Medicine

## 2015-06-20 ENCOUNTER — Encounter (HOSPITAL_BASED_OUTPATIENT_CLINIC_OR_DEPARTMENT_OTHER): Payer: Self-pay

## 2015-06-20 ENCOUNTER — Emergency Department (HOSPITAL_BASED_OUTPATIENT_CLINIC_OR_DEPARTMENT_OTHER)
Admission: EM | Admit: 2015-06-20 | Discharge: 2015-06-20 | Disposition: A | Discharge status: Home / Self Care | Payer: BLUE CROSS/BLUE SHIELD | Attending: Emergency Medicine | Admitting: Emergency Medicine

## 2015-06-20 DIAGNOSIS — R590 Localized enlarged lymph nodes: Secondary | ICD-10-CM | POA: Insufficient documentation

## 2015-06-20 DIAGNOSIS — M542 Cervicalgia: Secondary | ICD-10-CM | POA: Diagnosis present

## 2015-06-20 DIAGNOSIS — Z880 Allergy status to penicillin: Secondary | ICD-10-CM | POA: Diagnosis not present

## 2015-06-20 MED ORDER — ONDANSETRON HCL 4 MG/2ML IJ SOLN: 4.0000 mg | Freq: Once | INTRAMUSCULAR | Status: DC

## 2015-06-20 MED ORDER — OXYCODONE-ACETAMINOPHEN 5-325 MG PO TABS: ORAL_TABLET | ORAL | Status: DC

## 2015-06-20 MED ORDER — ONDANSETRON HCL 4 MG PO TABS: 4.0000 mg | ORAL_TABLET | Freq: Three times a day (TID) | ORAL | Status: DC | PRN

## 2015-06-20 MED ORDER — ONDANSETRON 4 MG PO TBDP
4.0000 mg | ORAL_TABLET | Freq: Once | ORAL | Status: AC
  Administered 2015-06-20: 4 mg via ORAL
  Filled 2015-06-20: qty 1

## 2015-06-20 MED ORDER — OXYCODONE-ACETAMINOPHEN 5-325 MG PO TABS
1.0000 | ORAL_TABLET | Freq: Once | ORAL | Status: AC
Start: 2015-06-20 — End: 2015-06-20
  Administered 2015-06-20: 1 via ORAL
  Filled 2015-06-20: qty 1

## 2015-06-20 NOTE — Discharge Instructions (Signed)
Take percocet for breakthrough pain, do not drink alcohol, drive, care for children or do other critical tasks while taking percocet.  Please follow with your primary care doctor in the next 2 days for a check-up. They must obtain records for further management.   Do not hesitate to return to the Emergency Department for any new, worsening or concerning symptoms.    Lymphadenopathy Lymphadenopathy means "disease of the lymph glands." But the term is usually used to describe swollen or enlarged lymph glands, also called lymph nodes. These are the bean-shaped organs found in many locations including the neck, underarm, and groin. Lymph glands are part of the immune system, which fights infections in your body. Lymphadenopathy can occur in just one area of the body, such as the neck, or it can be generalized, with lymph node enlargement in several areas. The nodes found in the neck are the most common sites of lymphadenopathy. CAUSES When your immune system responds to germs (such as viruses or bacteria ), infection-fighting cells and fluid build up. This causes the glands to grow in size. Usually, this is not something to worry about. Sometimes, the glands themselves can become infected and inflamed. This is called lymphadenitis. Enlarged lymph nodes can be caused by many diseases:  Bacterial disease, such as strep throat or a skin infection.  Viral disease, such as a common cold.  Other germs, such as Lyme disease, tuberculosis, or sexually transmitted diseases.  Cancers, such as lymphoma (cancer of the lymphatic system) or leukemia (cancer of the white blood cells).  Inflammatory diseases such as lupus or rheumatoid arthritis.  Reactions to medications. Many of the diseases above are rare, but important. This is why you should see your caregiver if you have lymphadenopathy. SYMPTOMS  Swollen, enlarged lumps in the neck, back of the head, or other locations.  Tenderness.  Warmth or  redness of the skin over the lymph nodes.  Fever. DIAGNOSIS Enlarged lymph nodes are often near the source of infection. They can help health care providers diagnose your illness. For instance:  Swollen lymph nodes around the jaw might be caused by an infection in the mouth.  Enlarged glands in the neck often signal a throat infection.  Lymph nodes that are swollen in more than one area often indicate an illness caused by a virus. Your caregiver will likely know what is causing your lymphadenopathy after listening to your history and examining you. Blood tests, x-rays, or other tests may be needed. If the cause of the enlarged lymph node cannot be found, and it does not go away by itself, then a biopsy may be needed. Your caregiver will discuss this with you. TREATMENT Treatment for your enlarged lymph nodes will depend on the cause. Many times the nodes will shrink to normal size by themselves, with no treatment. Antibiotics or other medicines may be needed for infection. Only take over-the-counter or prescription medicines for pain, discomfort, or fever as directed by your caregiver. HOME CARE INSTRUCTIONS Swollen lymph glands usually return to normal when the underlying medical condition goes away. If they persist, contact your health-care provider. He/she might prescribe antibiotics or other treatments, depending on the diagnosis. Take any medications exactly as prescribed. Keep any follow-up appointments made to check on the condition of your enlarged nodes. SEEK MEDICAL CARE IF:  Swelling lasts for more than two weeks.  You have symptoms such as weight loss, night sweats, fatigue, or fever that does not go away.  The lymph nodes are hard, seem  fixed to the skin, or are growing rapidly.  Skin over the lymph nodes is red and inflamed. This could mean there is an infection. SEEK IMMEDIATE MEDICAL CARE IF:  Fluid starts leaking from the area of the enlarged lymph node.  You develop a  fever of 102 F (38.9 C) or greater.  Severe pain develops (not necessarily at the site of a large lymph node).  You develop chest pain or shortness of breath.  You develop worsening abdominal pain. MAKE SURE YOU:  Understand these instructions.  Will watch your condition.  Will get help right away if you are not doing well or get worse. Document Released: 08/06/2008 Document Revised: 03/14/2014 Document Reviewed: 08/06/2008 [facility] Patient Information 2015 [place], [place]. This information is not intended to replace advice given to you by your health care provider. Make sure you discuss any questions you have with your health care provider.

## 2015-06-20 NOTE — ED Notes (Signed)
Pt reports abscess x 2 to back of head since Friday. Seen at [facility], no relief.

## 2015-06-20 NOTE — ED Provider Notes (Signed)
CSN: [id number]     Arrival date & time 06/20/15  2011 History  This chart was scribed for non-physician practitioner, Monico Blitz, PA-C, working with Wandra Arthurs, MD, by Stephania Fragmin, ED Scribe. This patient was seen in room MH06/MH06 and the patient's care was started at 8:45 PM.    Chief Complaint  Patient presents with  . Abscess   The history is provided by the patient. No language interpreter was used.    HPI Comments: [name] is a 34 y.o. female who presents to the Emergency Department complaining of 2 "knots" to her posterior neck that she first noticed about 1 week ago, and which have become more hardened and painful since. Patient states she was stung by a bee on her elbow. She then noticed a knot to her posterior neck later that night and another besides it the day after. She was then seen at [facility] 4 days ago and had a mono screen that was negative. She was also given a 14-day course of prednisone and Bactrim that she had taken with no relief. She has also applied heat and ice, ibuprofen, and Percocet, all with no relief. She denies fever, chills, nausea, vomiting, unexpected weight loss, bleeding or bruising easily, or night sweats. Patient states she is establishing a PCP in the following week. She also states she does breast self-exams at home.   History reviewed. No pertinent past medical history. Past Surgical History  Procedure Laterality Date  . Tubal ligation  2005  . Oophorectomy Left 2007  . Ovarian cyst surgery  2013  . Appendectomy  1984  . Salpingoophorectomy     Family History  Problem Relation Age of Onset  . Hypertension Mother   . Hypertension Father    History  Substance Use Topics  . Smoking status: Never Smoker   . Smokeless tobacco: Not on file  . Alcohol Use: No     Comment: denies   OB History    No data available     Review of Systems  Constitutional: Negative for fever, chills, diaphoresis, appetite change and unexpected weight  change.  Gastrointestinal: Negative for nausea and vomiting.  Hematological: Does not bruise/bleed easily.  All other systems reviewed and are negative.  Allergies  Codeine; Hydrocodone; and Penicillins  Home Medications   Prior to Admission medications   Medication Sig Start Date End Date Taking? Authorizing Provider  ondansetron (ZOFRAN ODT) 4 MG disintegrating tablet 4mg  ODT q4 hours prn nausea/vomit 03/31/15   Debby Freiberg, MD  oxyCODONE-acetaminophen (PERCOCET/ROXICET) 5-325 MG per tablet Take 1-2 tablets by mouth every 4 (four) hours as needed. 03/31/15   Debby Freiberg, MD   BP 152/95 mmHg  Pulse 109  Temp(Src) 98.3 F (36.8 C) (Oral)  Resp 14  Ht 5\' 5"  (1.651 m)  Wt 190 lb (86.183 kg)  BMI 31.62 kg/m2  SpO2 100%  LMP 06/05/2015 Physical Exam  Constitutional: She is oriented to person, place, and time. She appears well-developed and well-nourished. No distress.  HENT:  Head: Normocephalic and atraumatic.  Mouth/Throat: Oropharynx is clear and moist.  Eyes: Conjunctivae and EOM are normal.  Neck: Neck supple. No tracheal deviation present.  Cardiovascular: Normal rate, regular rhythm and intact distal pulses.   Pulmonary/Chest: Effort normal. No respiratory distress.  Musculoskeletal: Normal range of motion.  Lymphadenopathy:       Head (right side): No submental, no submandibular, no tonsillar, no preauricular, no posterior auricular and no occipital adenopathy present.  Head (left side): No submental, no submandibular, no tonsillar, no preauricular, no posterior auricular and no occipital adenopathy present.    She has cervical adenopathy.       Right cervical: Posterior cervical adenopathy present. No superficial cervical adenopathy present.      Left cervical: No superficial cervical, no deep cervical and no posterior cervical adenopathy present.       Right axillary: No pectoral adenopathy present.       Left axillary: No pectoral adenopathy present.       Right: No inguinal, no supraclavicular and no epitrochlear adenopathy present.       Left: No inguinal, no supraclavicular and no epitrochlear adenopathy present.  Neurological: She is alert and oriented to person, place, and time.  Skin: Skin is warm and dry.  Psychiatric: She has a normal mood and affect. Her behavior is normal.  Nursing note and vitals reviewed.   ED Course  Procedures (including critical care time)  DIAGNOSTIC STUDIES: Oxygen Saturation is 100% on RA, normal by my interpretation.    COORDINATION OF CARE: 8:48 PM - Discussed treatment plan with pt at bedside which includes ice, Rx narcotic pain medication, and f/u with PCP, and pt agreed to plan.   MDM   Final diagnoses:  Lymphadenopathy of head and neck region   Filed Vitals:   06/20/15 2026 06/20/15 2122  BP: 152/95 142/73  Pulse: 109 102  Temp: 98.3 F (36.8 C)   TempSrc: Oral   Resp: 14 14  Height: 5\' 5"  (1.651 m)   Weight: 190 lb (86.183 kg)   SpO2: 100% 98%    Medications  oxyCODONE-acetaminophen (PERCOCET/ROXICET) 5-325 MG per tablet 1 tablet (not administered)    [name] is a pleasant 34 y.o. female presenting with focal tender enlarged right posterior cervical lymph nodes. Physical exam is not consistent with abscess. She has no other appreciable lymphadenopathy. No B symptoms. Has appointment with primary care doctor, she's also on a 14 day prednisone taper. Advised her to continue with that and follow closely with primary care.  Evaluation does not show pathology that would require ongoing emergent intervention or inpatient treatment. Pt is hemodynamically stable and mentating appropriately. Discussed findings and plan with patient/guardian, who agrees with care plan. All questions answered. Return precautions discussed and outpatient follow up given.   New Prescriptions   ONDANSETRON (ZOFRAN) 4 MG TABLET    Take 1 tablet (4 mg total) by mouth every 8 (eight) hours as needed for  nausea or vomiting.   OXYCODONE-ACETAMINOPHEN (PERCOCET/ROXICET) 5-325 MG PER TABLET    1 to 2 tabs PO q6hrs  PRN for pain     I personally performed the services described in this documentation, which was scribed in my presence. The recorded information has been reviewed and is accurate.     Makensey Rego, PA-C 06/20/15 2212  Wandra Arthurs, MD 06/20/15 2352

## 2015-06-20 NOTE — ED Notes (Signed)
Pt states got stung 8 days ago to back of neck,  States has hardener area and pain since,  Has been seen for same is on antibiotic and prednisone

## 2015-06-26 ENCOUNTER — Ambulatory Visit (HOSPITAL_BASED_OUTPATIENT_CLINIC_OR_DEPARTMENT_OTHER)
Admission: RE | Admit: 2015-06-26 | Discharge: 2015-06-26 | Disposition: A | Discharge status: Home / Self Care | Payer: BLUE CROSS/BLUE SHIELD | Source: Ambulatory Visit | Attending: Family Medicine | Admitting: Family Medicine

## 2015-06-26 ENCOUNTER — Ambulatory Visit (INDEPENDENT_AMBULATORY_CARE_PROVIDER_SITE_OTHER): Payer: BLUE CROSS/BLUE SHIELD | Admitting: Family Medicine

## 2015-06-26 ENCOUNTER — Encounter: Payer: Self-pay | Admitting: Family Medicine

## 2015-06-26 VITALS — BP 127/87 | HR 86 | Temp 98.1°F | Ht 65.0 in | Wt 225.0 lb

## 2015-06-26 DIAGNOSIS — R599 Enlarged lymph nodes, unspecified: Secondary | ICD-10-CM

## 2015-06-26 DIAGNOSIS — R591 Generalized enlarged lymph nodes: Secondary | ICD-10-CM | POA: Diagnosis not present

## 2015-06-26 LAB — CBC WITH DIFFERENTIAL/PLATELET
BASOS ABS: 0 10*3/uL (ref 0.0–0.1)
BASOS PCT: 0.5 % (ref 0.0–3.0)
Eosinophils Absolute: 0.2 10*3/uL (ref 0.0–0.7)
Eosinophils Relative: 3.3 % (ref 0.0–5.0)
HCT: 39.5 % (ref 36.0–46.0)
Hemoglobin: 13.2 g/dL (ref 12.0–15.0)
LYMPHS PCT: 42.2 % (ref 12.0–46.0)
Lymphs Abs: 2.6 10*3/uL (ref 0.7–4.0)
MCHC: 33.5 g/dL (ref 30.0–36.0)
MCV: 85.8 fl (ref 78.0–100.0)
MONOS PCT: 7.3 % (ref 3.0–12.0)
Monocytes Absolute: 0.5 10*3/uL (ref 0.1–1.0)
NEUTROS ABS: 2.9 10*3/uL (ref 1.4–7.7)
Neutrophils Relative %: 46.7 % (ref 43.0–77.0)
PLATELETS: 419 10*3/uL — AB (ref 150.0–400.0)
RBC: 4.6 Mil/uL (ref 3.87–5.11)
RDW: 14.9 % (ref 11.5–15.5)
WBC: 6.2 10*3/uL (ref 4.0–10.5)

## 2015-06-26 LAB — COMPREHENSIVE METABOLIC PANEL
ALK PHOS: 59 U/L (ref 39–117)
ALT: 20 U/L (ref 0–35)
AST: 20 U/L (ref 0–37)
Albumin: 4 g/dL (ref 3.5–5.2)
BUN: 13 mg/dL (ref 6–23)
CO2: 29 meq/L (ref 19–32)
Calcium: 9 mg/dL (ref 8.4–10.5)
Chloride: 105 mEq/L (ref 96–112)
Creatinine, Ser: 0.97 mg/dL (ref 0.40–1.20)
GFR: 83.56 mL/min (ref >60.00)
Glucose, Bld: 94 mg/dL (ref 70–99)
Potassium: 4 mEq/L (ref 3.5–5.1)
Sodium: 138 mEq/L (ref 135–145)
Total Bilirubin: 0.3 mg/dL (ref 0.2–1.2)
Total Protein: 7.3 g/dL (ref 6.0–8.3)

## 2015-06-26 NOTE — Progress Notes (Signed)
Pre visit review using our clinic review tool, if applicable. No additional management support is needed unless otherwise documented below in the visit note. 

## 2015-06-26 NOTE — Patient Instructions (Signed)

## 2015-06-26 NOTE — Progress Notes (Signed)
Patient ID: [name], female    DOB: December 22, 1980  Age: 34 y.o. MRN: [id number]    Subjective:  Subjective HPI [name] presents to establish and she c/o swollen lymph nodes R side of neck x weeks.  They started out very painful but after prednisone and a course of abx it has improved.  They are no longer painful.    Review of Systems  Constitutional: Negative for diaphoresis, appetite change, fatigue and unexpected weight change.  Eyes: Negative for pain, redness and visual disturbance.  Respiratory: Negative for cough, chest tightness, shortness of breath and wheezing.   Cardiovascular: Negative for chest pain, palpitations and leg swelling.  Endocrine: Negative for cold intolerance, heat intolerance, polydipsia, polyphagia and polyuria.  Genitourinary: Negative for dysuria, frequency and difficulty urinating.  Neurological: Negative for dizziness, light-headedness, numbness and headaches.  Hematological: Positive for adenopathy.    History History reviewed. No pertinent past medical history.  She has past surgical history that includes Tubal ligation (2005); Oophorectomy (Left, 2007); Ovarian cyst surgery (2013); Appendectomy (1984); and Salpingoophorectomy.   Her family history includes Hypertension in her father and mother.She reports that she has never smoked. She does not have any smokeless tobacco history on file. She reports that she does not drink alcohol or use illicit drugs.  Current Outpatient Prescriptions on File Prior to Visit  Medication Sig Dispense Refill  . ondansetron (ZOFRAN ODT) 4 MG disintegrating tablet 4mg  ODT q4 hours prn nausea/vomit 10 tablet 0  . ondansetron (ZOFRAN) 4 MG tablet Take 1 tablet (4 mg total) by mouth every 8 (eight) hours as needed for nausea or vomiting. 10 tablet 0  . oxyCODONE-acetaminophen (PERCOCET/ROXICET) 5-325 MG per tablet 1 to 2 tabs PO q6hrs  PRN for pain 15 tablet 0  . [DISCONTINUED] estradiol (ESTRACE) 1 MG tablet  Take 1 mg by mouth 2 (two) times daily.    . [DISCONTINUED] progesterone (PROMETRIUM) 200 MG capsule Take 400 mg by mouth at bedtime.     No current facility-administered medications on file prior to visit.     Objective:  Objective Physical Exam  Constitutional: She is oriented to person, place, and time. She appears well-developed and well-nourished.  HENT:  Head: Normocephalic and atraumatic.  Eyes: Conjunctivae and EOM are normal.  Neck: Normal range of motion. Neck supple. No JVD present. Carotid bruit is not present. No thyromegaly present.    Cardiovascular: Normal rate, regular rhythm, normal heart sounds and intact distal pulses.   No murmur heard. Pulmonary/Chest: Effort normal and breath sounds normal. No respiratory distress. She has no wheezes. She has no rales. She exhibits no tenderness.  Musculoskeletal: She exhibits no edema.  Lymphadenopathy:    She has cervical adenopathy.       Right cervical: Posterior cervical adenopathy present.  Post cervical lymphnodes enlarged-- non tender  Neurological: She is alert and oriented to person, place, and time.  Psychiatric: She has a normal mood and affect. Her behavior is normal.  Nursing note and vitals reviewed.  BP 127/87 mmHg  Pulse 86  Temp(Src) 98.1 F (36.7 C) (Oral)  Ht 5\' 5"  (1.651 m)  Wt 225 lb (102.059 kg)  BMI 37.44 kg/m2  SpO2 99%  LMP 06/05/2015 Wt Readings from Last 3 Encounters:  06/26/15 225 lb (102.059 kg)  06/20/15 190 lb (86.183 kg)  03/31/15 190 lb (86.183 kg)     Lab Results  Component Value Date   WBC 6.1 03/31/2015   HGB 13.6 03/31/2015   HCT 40.4  03/31/2015   PLT 356 03/31/2015   GLUCOSE 92 03/31/2015   ALT 31 03/31/2015   AST 26 03/31/2015   NA 140 03/31/2015   K 3.6 03/31/2015   CL 105 03/31/2015   CREATININE 1.07* 03/31/2015   BUN 11 03/31/2015   CO2 27 03/31/2015   INR 1.03 03/20/2011    No results found.   Assessment & Plan:  Plan I have discontinued Ms. Peek's  sulfamethoxazole-trimethoprim. I am also having her maintain her ondansetron, oxyCODONE-acetaminophen, ondansetron, and diclofenac.  Meds ordered this encounter  Medications  . DISCONTD: sulfamethoxazole-trimethoprim (BACTRIM DS,SEPTRA DS) 800-160 MG per tablet    Sig: Take 1 tablet by mouth 2 (two) times daily.  . diclofenac (VOLTAREN) 50 MG EC tablet    Sig: Take 50 mg by mouth 2 (two) times daily.    Problem List Items Addressed This Visit    None    Visit Diagnoses    Enlarged lymph nodes    -  Primary    Relevant Orders    CBC with Differential/Platelet    Comprehensive metabolic panel    US Soft Tissue Head/Neck       Follow-up: Return if symptoms worsen or fail to improve.  Garnet Koyanagi, DO

## 2015-08-07 ENCOUNTER — Telehealth: Payer: Self-pay | Admitting: *Deleted

## 2015-08-07 NOTE — Telephone Encounter (Signed)
Unable to reach patient at time of Pre-Visit Call.  Left message to confirm appointment.  

## 2015-08-08 ENCOUNTER — Telehealth: Payer: Self-pay | Admitting: Family Medicine

## 2015-08-08 ENCOUNTER — Ambulatory Visit: Payer: BLUE CROSS/BLUE SHIELD | Admitting: Family Medicine

## 2015-08-10 NOTE — Telephone Encounter (Signed)
no

## 2015-08-10 NOTE — Telephone Encounter (Signed)
Pt was no show 08/08/15 3:30pm, new pt appt, pt lvm at 9/27 2:57 to cancel appt due to insurance issues. coverage is inactive. She will cb to reschedule. Charge for no show?

## 2016-05-04 DIAGNOSIS — H5213 Myopia, bilateral: Secondary | ICD-10-CM | POA: Diagnosis not present

## 2016-10-06 ENCOUNTER — Ambulatory Visit (HOSPITAL_COMMUNITY)
Admission: EM | Admit: 2016-10-06 | Discharge: 2016-10-06 | Disposition: A | Discharge status: Home / Self Care | Payer: 59 | Attending: Family Medicine | Admitting: Family Medicine

## 2016-10-06 ENCOUNTER — Encounter (HOSPITAL_COMMUNITY): Payer: Self-pay | Admitting: Emergency Medicine

## 2016-10-06 DIAGNOSIS — J0100 Acute maxillary sinusitis, unspecified: Secondary | ICD-10-CM | POA: Diagnosis not present

## 2016-10-06 DIAGNOSIS — R059 Cough, unspecified: Secondary | ICD-10-CM

## 2016-10-06 DIAGNOSIS — R05 Cough: Secondary | ICD-10-CM

## 2016-10-06 MED ORDER — PROMETHAZINE HCL 6.25 MG/5ML PO SYRP
6.2500 mg | ORAL_SOLUTION | Freq: Four times a day (QID) | ORAL | 0 refills | Status: DC | PRN
Start: 2016-10-06 — End: 2018-01-13

## 2016-10-06 MED ORDER — LEVOFLOXACIN 500 MG PO TABS: 500.0000 mg | ORAL_TABLET | Freq: Every day | ORAL | 0 refills | Status: DC

## 2016-10-06 NOTE — ED Provider Notes (Signed)
[facility]    CSN: [id number] Arrival date & time: 10/06/16  1600     History   Chief Complaint Chief Complaint  Patient presents with  . Facial Pain  . Nasal Congestion    HPI [name] is a 35 y.o. female.   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 who has one week of cough, sinus congestion, facial pressure.      History reviewed. No pertinent past medical history.  Patient Active Problem List   Diagnosis Date Noted  . GASTRITIS 04/13/2009  . 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 BACK PAIN 01/18/2009  . HEADACHE 01/18/2009    Past Surgical History:  Procedure Laterality Date  . APPENDECTOMY  1984  . OOPHORECTOMY Left 2007  . OVARIAN CYST SURGERY  2013  . SALPINGOOPHORECTOMY    . TUBAL LIGATION  2005    OB History    No data available       Home Medications    Prior to Admission medications   Medication Sig Start Date End Date Taking? Authorizing Provider  diclofenac (VOLTAREN) 50 MG EC tablet Take 50 mg by mouth 2 (two) times daily.    Historical Provider, MD  levofloxacin (LEVAQUIN) 500 MG tablet Take 1 tablet (500 mg total) by mouth daily. 10/06/16   Robyn Haber, MD  promethazine (PHENERGAN) 6.25 MG/5ML syrup Take 5 mLs (6.25 mg total) by mouth every 6 (six) hours as needed (cough). 10/06/16   Robyn Haber, MD    Family History Family History  Problem Relation Age of Onset  . Hypertension Mother   . Hypertension Father     Social History Social History  Substance Use Topics  . Smoking status: Never Smoker  . Smokeless tobacco: Never Used  . Alcohol use No     Comment: denies     Allergies   Codeine; Hydrocodone; and Penicillins   Review of Systems Review of Systems  Constitutional: Negative.   HENT: Positive for facial swelling, sinus pain, sinus pressure and sore throat.   Respiratory:  Positive for cough. Negative for shortness of breath.   Cardiovascular: Negative.   Gastrointestinal: Positive for nausea.  Genitourinary: Negative.   Musculoskeletal: Negative.   Neurological: Negative.      Physical Exam Triage Vital Signs ED Triage Vitals  Enc Vitals Group     BP 10/06/16 1654 120/87     Pulse Rate 10/06/16 1654 98     Resp 10/06/16 1654 16     Temp 10/06/16 1654 98.5 F (36.9 C)     Temp Source 10/06/16 1654 Oral     SpO2 10/06/16 1654 99 %     Weight 10/06/16 1651 195 lb (88.5 kg)     Height 10/06/16 1651 5\' 6"  (1.676 m)     Head Circumference --      Peak Flow --      Pain Score 10/06/16 1653 6     Pain Loc --      Pain Edu? --      Excl. in [place]? --    No data found.   Updated Vital Signs BP 120/87   Pulse 98   Temp 98.5 F (36.9 C) (Oral)   Resp 16   Ht 5\' 6"  (1.676 m)   Wt 195 lb (88.5 kg)   LMP 08/30/2016   SpO2 99%   BMI 31.47 kg/m        Physical  Exam  Constitutional: She is oriented to person, place, and time. She appears well-developed and well-nourished.  HENT:  Head: Normocephalic.  Right Ear: External ear normal.  Left Ear: External ear normal.  Mouth/Throat: Oropharynx is clear and moist.  Tender maxillary sinus area of the face  Eyes: Conjunctivae and EOM are normal.  Neck: Normal range of motion. Neck supple.  Cardiovascular: Normal rate, regular rhythm and normal heart sounds.   Pulmonary/Chest: Effort normal and breath sounds normal.  Musculoskeletal: Normal range of motion.  Neurological: She is alert and oriented to person, place, and time.  Skin: Skin is warm and dry.  Nursing note and vitals reviewed.    UC Treatments / Results  Labs (all labs ordered are listed, but only abnormal results are displayed) Labs Reviewed - No data to display  EKG  EKG Interpretation None       Radiology No results found.  Procedures Procedures (including critical care time)  Medications Ordered in UC Medications  - No data to display   Initial Impression / Assessment and Plan / UC Course  I have reviewed the triage vital signs and the nursing notes.  Pertinent labs & imaging results that were available during my care of the patient were reviewed by me and considered in my medical decision making (see chart for details).  Clinical Course     Final Clinical Impressions(s) / UC Diagnoses   Final diagnoses:  Acute maxillary sinusitis, recurrence not specified  Cough    New Prescriptions New Prescriptions   LEVOFLOXACIN (LEVAQUIN) 500 MG TABLET    Take 1 tablet (500 mg total) by mouth daily.   PROMETHAZINE (PHENERGAN) 6.25 MG/5ML SYRUP    Take 5 mLs (6.25 mg total) by mouth every 6 (six) hours as needed (cough).     Robyn Haber, MD 10/06/16 1730

## 2016-10-06 NOTE — ED Triage Notes (Signed)
PT reports sinus congestion, productive cough, and chest congestion for 1 week. PT reports fever yesterday of 101.9

## 2016-10-07 MED FILL — PROMETHAZINE 6.25 MG/5 ML S: 6.25 | 6 days supply | Qty: 120 | Fill #0

## 2016-10-07 MED FILL — levoFLOXacin 500 MG TABS: 500 | 7 days supply | Qty: 7 | Fill #0

## 2016-11-01 MED FILL — FLUCONAZOLE 150 MG TABLET: 150 | 1 days supply | Qty: 1 | Fill #0

## 2016-11-01 MED FILL — CLINDAMYCIN HCL 150 MG CAP: 150 | 7 days supply | Qty: 56 | Fill #0

## 2016-11-01 MED FILL — IBUPROFEN 800 MG TABLET: 800 | 4 days supply | Qty: 20 | Fill #0

## 2017-05-17 DIAGNOSIS — H5213 Myopia, bilateral: Secondary | ICD-10-CM | POA: Diagnosis not present

## 2017-06-04 DIAGNOSIS — Z01419 Encounter for gynecological examination (general) (routine) without abnormal findings: Secondary | ICD-10-CM | POA: Diagnosis not present

## 2017-06-04 DIAGNOSIS — Z6839 Body mass index (BMI) 39.0-39.9, adult: Secondary | ICD-10-CM | POA: Diagnosis not present

## 2017-06-12 DIAGNOSIS — Z1151 Encounter for screening for human papillomavirus (HPV): Secondary | ICD-10-CM | POA: Diagnosis not present

## 2017-06-12 DIAGNOSIS — Z124 Encounter for screening for malignant neoplasm of cervix: Secondary | ICD-10-CM | POA: Diagnosis not present

## 2017-09-01 DIAGNOSIS — E288 Other ovarian dysfunction: Secondary | ICD-10-CM | POA: Diagnosis not present

## 2017-09-01 DIAGNOSIS — Z31430 Encounter of female for testing for genetic disease carrier status for procreative management: Secondary | ICD-10-CM | POA: Diagnosis not present

## 2017-09-01 DIAGNOSIS — N809 Endometriosis, unspecified: Secondary | ICD-10-CM | POA: Diagnosis not present

## 2017-09-01 DIAGNOSIS — N83292 Other ovarian cyst, left side: Secondary | ICD-10-CM | POA: Diagnosis not present

## 2017-09-01 DIAGNOSIS — Z319 Encounter for procreative management, unspecified: Secondary | ICD-10-CM | POA: Diagnosis not present

## 2017-09-01 DIAGNOSIS — N801 Endometriosis of ovary: Secondary | ICD-10-CM | POA: Diagnosis not present

## 2018-01-13 ENCOUNTER — Other Ambulatory Visit: Payer: Self-pay

## 2018-01-13 ENCOUNTER — Ambulatory Visit (INDEPENDENT_AMBULATORY_CARE_PROVIDER_SITE_OTHER): Payer: 59 | Admitting: Physician Assistant

## 2018-01-13 ENCOUNTER — Encounter: Payer: Self-pay | Admitting: Physician Assistant

## 2018-01-13 VITALS — BP 110/80 | HR 140 | Temp 100.3°F | Resp 18 | Ht 66.0 in | Wt 223.6 lb

## 2018-01-13 DIAGNOSIS — R6889 Other general symptoms and signs: Secondary | ICD-10-CM

## 2018-01-13 DIAGNOSIS — R059 Cough, unspecified: Secondary | ICD-10-CM

## 2018-01-13 DIAGNOSIS — K7689 Other specified diseases of liver: Secondary | ICD-10-CM | POA: Insufficient documentation

## 2018-01-13 DIAGNOSIS — R509 Fever, unspecified: Secondary | ICD-10-CM | POA: Diagnosis not present

## 2018-01-13 DIAGNOSIS — R05 Cough: Secondary | ICD-10-CM | POA: Diagnosis not present

## 2018-01-13 MED ORDER — IPRATROPIUM BROMIDE 0.06 % NA SOLN: 2.0000 | Freq: Three times a day (TID) | NASAL | 0 refills | Status: DC

## 2018-01-13 MED ORDER — BENZONATATE 100 MG PO CAPS: ORAL_CAPSULE | ORAL | 0 refills | Status: AC

## 2018-01-13 MED ORDER — PROMETHAZINE-DM 6.25-15 MG/5ML PO SYRP: 2.5000 mL | ORAL_SOLUTION | Freq: Four times a day (QID) | ORAL | 0 refills | Status: DC | PRN

## 2018-01-13 NOTE — Patient Instructions (Addendum)
Please push fluids.  Tylenol and Aleve (220mg  - take 2 pills 2x/day) for fever and body aches.    A humidifier can help especially when the air is dry -if you do not have a humidifier you can boil a pot of water on the stove in your home to help with the dry air.  Nasal saline spray can be helpful to keep the mucus membranes moist and thin the nasal mucus   IF you received an x-ray today, you will receive an invoice from [facility]. Please contact [facility] at [phone] with questions or concerns regarding your invoice.   IF you received labwork today, you will receive an invoice from [facility]. Please contact [facility] at [phone] with questions or concerns regarding your invoice.   Our billing staff will not be able to assist you with questions regarding bills from these companies.  You will be contacted with the lab results as soon as they are available. The fastest way to get your results is to activate your My Chart account. Instructions are located on the last page of this paperwork. If you have not heard from [place] regarding the results in 2 weeks, please contact this office.

## 2018-01-13 NOTE — Progress Notes (Signed)
[name]  MRN: [id number] DOB: Jul 02, 1981  PCP: Mancel Bale, PA-C  Chief Complaint  Patient presents with  . Fever    x3days   . Cough    x2weeks very congested     Subjective:  Pt presents to clinic for cold symptoms that she has had for about 2 weeks.  She started with change in symptoms that were worse Friday evening - with sweats and fevers and myalgias and cough.  Cough is productive in the am and more dry in the afternoon.  Sleeping sitting up because she is so congested and the cough is not allowing her to sleep well.  She has taken many OTC cold preps that are not really helping her symptoms.  She did move dusty boxes before this started but this is different than her normal allergies.  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    History is obtained by patient.  Review of Systems  Constitutional: Positive for chills, fatigue and fever.  HENT: Positive for congestion, postnasal drip and rhinorrhea.   Respiratory: Positive for cough. Negative for shortness of breath and wheezing.        No h/o asthma, nonsmoker  Gastrointestinal: Negative.   Musculoskeletal: Positive for myalgias.  Neurological: Positive for headaches.  Psychiatric/Behavioral: Negative for sleep disturbance.    Patient Active Problem List   Diagnosis Date Noted  . 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 GASTRITIS 04/13/2009  . 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 BACK PAIN 01/18/2009  . HEADACHE 01/18/2009    Current Outpatient Medications on File Prior to Visit  Medication Sig Dispense Refill  . [DISCONTINUED] estradiol (ESTRACE) 1 MG tablet Take 1 mg by mouth 2 (two) times daily.    . [DISCONTINUED] progesterone (PROMETRIUM) 200 MG capsule Take 400 mg by mouth at bedtime.     No current facility-administered medications on file prior to visit.     Allergies  Allergen Reactions  . Codeine     Hives and itching  . Hydrocodone Hives and Itching  . Penicillins    Hives and itching    History reviewed. No pertinent past medical history. 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 Smoking status: Never Smoker  . Smokeless tobacco: Never Used  Substance Use Topics  . Alcohol use: No    Comment: denies  . Drug use: No   family history includes Hypertension in her father and mother.     Objective:  BP 110/80   Pulse (!) 140   Temp 100.3 F (37.9 C) (Oral)   Resp 18   Ht 5\' 6"  (1.676 m)   Wt 223 lb 9.6 oz (101.4 kg)   LMP 12/30/2017   SpO2 97%   BMI 36.09 kg/m  Body mass index is 36.09 kg/m.  Physical Exam  Constitutional: She is oriented to person, place, and time and well-developed, well-nourished, and in no distress.  HENT:  Head: Normocephalic and atraumatic.  Right Ear: Hearing, tympanic membrane, external ear and ear canal normal.  Left Ear: Hearing, tympanic membrane, external ear and ear canal normal.  Nose: Nose normal.  Mouth/Throat: Uvula is midline, oropharynx is clear and moist and mucous membranes are normal.  Eyes: Conjunctivae are normal.  Neck: Normal range of motion.  Cardiovascular: Normal rate, regular rhythm and normal heart sounds.  No murmur heard. Pulmonary/Chest:  Effort normal and breath sounds normal.  Neurological: She is alert and oriented to person, place, and time. Gait normal.  Skin: Skin is warm and dry.  Psychiatric: Mood, memory, affect and judgment normal.  Vitals reviewed.   Assessment and Plan :  Fever, unspecified  Cough - Plan: benzonatate (TESSALON) 100 MG capsule, promethazine-dextromethorphan (PROMETHAZINE-DM) 6.25-15 MG/5ML syrup  Flu-like symptoms - Plan: ipratropium (ATROVENT) 0.06 % nasal spray   Treat symptoms -- pt will rest - this is likely the flu but she is outside of the window for testing or treatment.  She will contact me if she continues to have symptoms in  5-7 days.  Windell Hummingbird PA-C  [facility] 01/13/2018 5:28 PM

## 2018-03-18 ENCOUNTER — Other Ambulatory Visit: Payer: Self-pay

## 2018-03-18 ENCOUNTER — Ambulatory Visit: Payer: 59 | Admitting: Physician Assistant

## 2018-03-18 ENCOUNTER — Encounter: Payer: Self-pay | Admitting: Physician Assistant

## 2018-03-18 VITALS — BP 104/76 | HR 90 | Temp 98.4°F | Ht 66.0 in | Wt 230.2 lb

## 2018-03-18 DIAGNOSIS — M7702 Medial epicondylitis, left elbow: Secondary | ICD-10-CM | POA: Diagnosis not present

## 2018-03-18 DIAGNOSIS — J301 Allergic rhinitis due to pollen: Secondary | ICD-10-CM

## 2018-03-18 MED ORDER — NAPROXEN 500 MG PO TABS: 500.0000 mg | ORAL_TABLET | Freq: Two times a day (BID) | ORAL | 0 refills | Status: DC

## 2018-03-18 NOTE — Progress Notes (Signed)
03/18/2018 2:42 PM   DOB: 01-25-1981 / MRN: [id number]  SUBJECTIVE:  [name] is a 37 y.o. female presenting for left medial elbow pain that started 2 weeks ago and has worsened over the last 4 days.  Patient has not tried any medication.  Using the hand for any task makes the pain worse.  She denies rash and swelling.  Complains of cough, sneeze, ear itching and throat itching over the last 2 weeks is getting worse.  She is taking Allegra and this helps some.  She would like some recommendations that would help.  She is allergic to codeine; hydrocodone; and penicillins.   She  has no past medical history on file.    She  reports that she has never smoked. She has never used smokeless tobacco. She reports that she does not drink alcohol or use drugs. She  reports that she currently engages in sexual activity and has had partners who are Female. She reports using the following method of birth control/protection: Surgical. The patient  has a past surgical history that includes Tubal ligation (2005); Oophorectomy (Left, 2007); Ovarian cyst surgery (2013); Appendectomy (1984); and Salpingoophorectomy.  Her family history includes Hypertension in her father and mother.  Review of Systems  Constitutional: Negative for chills, diaphoresis and fever.  Eyes: Negative.   Respiratory: Negative for cough, hemoptysis, sputum production, shortness of breath and wheezing.   Cardiovascular: Negative for chest pain, orthopnea and leg swelling.  Gastrointestinal: Negative for abdominal pain, blood in stool, constipation, diarrhea, heartburn, melena, nausea and vomiting.  Genitourinary: Negative for dysuria, flank pain, frequency, hematuria and urgency.  Musculoskeletal: Positive for joint pain. Negative for back pain, falls, myalgias and neck pain.  Skin: Negative for rash.  Neurological: Negative for dizziness, sensory change, speech change, focal weakness and headaches.    The problem list and  medications were reviewed and updated by myself where necessary and exist elsewhere in the encounter.   OBJECTIVE:  BP 104/76 (BP Location: Left Arm, Patient Position: Sitting, Cuff Size: Large)   Pulse 90   Temp 98.4 F (36.9 C) (Oral)   Ht 5\' 6"  (1.676 m)   Wt 230 lb 3.2 oz (104.4 kg)   LMP 03/12/2018   SpO2 98%   BMI 37.16 kg/m   Physical Exam  Constitutional: She is oriented to person, place, and time. She appears well-nourished. No distress.  HENT:  Right Ear: Hearing, tympanic membrane, external ear and ear canal normal.  Left Ear: Hearing, tympanic membrane, external ear and ear canal normal.  Nose: Nose normal. Right sinus exhibits no maxillary sinus tenderness and no frontal sinus tenderness. Left sinus exhibits no maxillary sinus tenderness and no frontal sinus tenderness.  Mouth/Throat: Uvula is midline, oropharynx is clear and moist and mucous membranes are normal. Mucous membranes are not dry. No oropharyngeal exudate, posterior oropharyngeal edema or tonsillar abscesses.  Eyes: Pupils are equal, round, and reactive to light. EOM are normal.  Cardiovascular: Normal rate, regular rhythm, S1 normal, S2 normal, normal heart sounds and intact distal pulses. Exam reveals no gallop, no friction rub and no decreased pulses.  No murmur heard. Pulmonary/Chest: Effort normal. She has no rales.  Abdominal: She exhibits no distension.  Musculoskeletal: She exhibits no edema.  Lymphadenopathy:       Head (right side): No submandibular and no tonsillar adenopathy present.       Head (left side): No submandibular and no tonsillar adenopathy present.    She has no cervical adenopathy.  Neurological: She is alert and oriented to person, place, and time. No cranial nerve deficit. Gait normal.  Skin: Skin is dry. She is not diaphoretic.  Psychiatric: She has a normal mood and affect.  Vitals reviewed.   No results found for this or any previous visit (from the past 72 hour(s)).  No  results found.  ASSESSMENT AND PLAN:  Trisha was seen today for elbow pain and allergies.  Diagnoses and all orders for this visit:  Medial epicondylitis, left: Tendon anchor provided.  -     naproxen (NAPROSYN) 500 MG tablet; Take 1 tablet (500 mg total) by mouth 2 (two) times daily with a meal.  Seasonal allergic rhinitis due to pollen: Per AVS.  Exam negative.     The patient is advised to call or return to clinic if she does not see an improvement in symptoms, or to seek the care of the closest emergency department if she worsens with the above plan.   Philis Fendt, MHS, PA-C [facility] 03/18/2018 2:42 PM

## 2018-03-18 NOTE — Patient Instructions (Signed)
Take the naproxen pain medication with you feel that you needed or not.  It is twice a day and try not to miss doses.  Take it with food.  For your allergy symptoms I recommend that you purchase some Xyzal, along with Flonase sensimist along with some 12-hour pseudoephedrine.   once your symptoms are controlled please stop the pseudoephedrine along with the Xyzal and stay with the Flonase.

## 2018-05-23 DIAGNOSIS — H524 Presbyopia: Secondary | ICD-10-CM | POA: Diagnosis not present

## 2018-09-07 ENCOUNTER — Ambulatory Visit: Payer: 59 | Admitting: Physician Assistant

## 2018-09-07 ENCOUNTER — Encounter: Payer: Self-pay | Admitting: Physician Assistant

## 2018-09-07 VITALS — BP 140/90 | HR 78 | Temp 98.1°F | Resp 16 | Ht 66.0 in | Wt 236.0 lb

## 2018-09-07 DIAGNOSIS — J069 Acute upper respiratory infection, unspecified: Secondary | ICD-10-CM

## 2018-09-07 DIAGNOSIS — J029 Acute pharyngitis, unspecified: Secondary | ICD-10-CM

## 2018-09-07 LAB — POCT RAPID STREP A (OFFICE): RAPID STREP A SCREEN: NEGATIVE

## 2018-09-07 MED ORDER — BENZONATATE 100 MG PO CAPS: 100.0000 mg | ORAL_CAPSULE | Freq: Three times a day (TID) | ORAL | 0 refills | Status: DC | PRN

## 2018-09-07 MED ORDER — IPRATROPIUM BROMIDE 0.03 % NA SOLN: 2.0000 | Freq: Two times a day (BID) | NASAL | 0 refills | Status: DC

## 2018-09-07 MED ORDER — MUCINEX DM MAXIMUM STRENGTH 60-1200 MG PO TB12: 1.0000 | ORAL_TABLET | Freq: Two times a day (BID) | ORAL | 0 refills | Status: DC

## 2018-09-07 MED ORDER — BENZOCAINE-MENTHOL 15-3.6 MG MT LOZG: 1.0000 | LOZENGE | OROMUCOSAL | 0 refills | Status: DC

## 2018-09-07 NOTE — Progress Notes (Signed)
MRN: [id number] DOB: [birth year]  Subjective:   [name] is a 37 y.o. female presenting for chief complaint of Sore Throat (x 4 days) and Nasal Congestion (drainage is causing nausea and ear pain/ x 3 days) .  Reports 4 day history of sinus drainage, nasal congestion, dry cough, body aches, ear pain, and sore throat. Has tried OTC sudafed with no full relief. Denies fever, sinus pain, difficulty swallowing, wheezing, shortness of breath, chest tightness and chest pain, vomiting, abdominal pain and diarrhea. Has not had sick contact with anyone. Has history of seasonal allergies, taking flonase. No history of asthma or COPD Patient has not had flu shot this season. Denies smoking. Denies any other aggravating or relieving factors, no other questions or concerns.  [name] has a current medication list which includes the following prescription(s): naproxen, estradiol, and progesterone. Also is allergic to codeine; hydrocodone; and penicillins.  [name]  has no past medical history on file. Also  has a past surgical history that includes Tubal ligation (2005); Oophorectomy (Left, 2007); Ovarian cyst surgery (2013); Appendectomy (1984); and Salpingoophorectomy.   Objective:   Vitals: BP 140/90   Pulse 78   Temp 98.1 F (36.7 C) (Oral)   Resp 16   Ht 5\' 6"  (1.676 m)   Wt 236 lb (107 kg)   SpO2 98%   BMI 38.09 kg/m   Physical Exam  Constitutional: She is oriented to person, place, and time. She appears well-developed and well-nourished. No distress.  HENT:  Head: Normocephalic and atraumatic.  Right Ear: External ear and ear canal normal. Tympanic membrane is not erythematous and not bulging. A middle ear effusion is present.  Left Ear: External ear and ear canal normal. Tympanic membrane is not erythematous and not bulging. A middle ear effusion is present.  Nose: Mucosal edema and rhinorrhea present. Right sinus exhibits no maxillary sinus tenderness and no frontal sinus  tenderness. Left sinus exhibits no maxillary sinus tenderness and no frontal sinus tenderness.  Mouth/Throat: Uvula is midline and mucous membranes are normal. Posterior oropharyngeal erythema present. No posterior oropharyngeal edema or tonsillar abscesses. Tonsils are 2+ on the right. Tonsils are 2+ on the left. No tonsillar exudate.  Tonsils are erythematous b/l  Eyes: Conjunctivae are normal.  Neck: Normal range of motion.  Cardiovascular: Normal rate, regular rhythm, normal heart sounds and intact distal pulses.  Pulmonary/Chest: Effort normal and breath sounds normal. She has no decreased breath sounds. She has no wheezes. She has no rhonchi. She has no rales.  Lymphadenopathy:       Head (right side): No submental, no submandibular, no tonsillar, no preauricular, no posterior auricular and no occipital adenopathy present.       Head (left side): No submental, no submandibular, no tonsillar, no preauricular, no posterior auricular and no occipital adenopathy present.    She has no cervical adenopathy.       Right: No supraclavicular adenopathy present.       Left: No supraclavicular adenopathy present.  Neurological: She is alert and oriented to person, place, and time.  Skin: Skin is warm and dry.  Psychiatric: She has a normal mood and affect.  Vitals reviewed.   Results for orders placed or performed in visit on 09/07/18 (from the past 24 hour(s))  POCT rapid strep A     Status: None   Collection Time: 09/07/18 10:37 AM  Result Value Ref Range   Rapid Strep A Screen Negative Negative    Assessment and Plan :  1. Acute upper respiratory infection - Likely viral in etiology d/t reassuring physical exam findings and labs. - Advised supportive care, offered symptomatic relief. - Return to clinic if symptoms worsen or fail to improve in 5-7 days, otherwise return to clinic as needed. - Dextromethorphan-guaiFENesin (MUCINEX DM MAXIMUM STRENGTH) 60-1200 MG TB12; Take 1 tablet by  mouth every 12 (twelve) hours.  Dispense: 20 each; Refill: 0 - benzonatate (TESSALON) 100 MG capsule; Take 1-2 capsules (100-200 mg total) by mouth 3 (three) times daily as needed for cough.  Dispense: 40 capsule; Refill: 0 - ipratropium (ATROVENT) 0.03 % nasal spray; Place 2 sprays into both nostrils 2 (two) times daily.  Dispense: 30 mL; Refill: 0 - Benzocaine-Menthol 15-3.6 MG LOZG; Use as directed 1 lozenge in the mouth or throat every 4 (four) hours.  Dispense: 18 each; Refill: 0  2. Sore throat - POCT rapid strep A - Culture, Group A Strep  Tenna Delaine, PA-C  [facility] 09/07/2018 11:35 AM

## 2018-09-07 NOTE — Patient Instructions (Addendum)
- We will treat this as a respiratory viral infection.  - I recommend you rest, drink plenty of fluids, eat light meals including soups.  - if you want to get mucus up, use mucinex. If you want to stop the cough use Tessalon pearls during the day. - Use atrovent nasal spray for runny nose. - You may also use ibuprofen 600-800mg  as prescribed over-the-counter for your sore throat. You can also use throat lozenges. Tea recipe for sore throat: boil water, add 2 inches shaved ginger root, steep 15 minutes, add juice from 2 full lemons, and 2 tbsp honey. - Please let me know if you are not seeing any improvement or get worse in 5-7 days.    If you have lab work done today you will be contacted with your lab results within the next 2 weeks.  If you have not heard from [place] then please contact us. The fastest way to get your results is to register for My Chart.  Upper Respiratory Infection, Adult Most upper respiratory infections (URIs) are caused by a virus. A URI affects the nose, throat, and upper air passages. The most common type of URI is often called "the common cold." Follow these instructions at home:  Take medicines only as told by your doctor.  Gargle warm saltwater or take cough drops to comfort your throat as told by your doctor.  Use a warm mist humidifier or inhale steam from a shower to increase air moisture. This may make it easier to breathe.  Drink enough fluid to keep your pee (urine) clear or pale yellow.  Eat soups and other clear broths.  Have a healthy diet.  Rest as needed.  Go back to work when your fever is gone or your doctor says it is okay. ? You may need to stay home longer to avoid giving your URI to others. ? You can also wear a face mask and wash your hands often to prevent spread of the virus.  Use your inhaler more if you have asthma.  Do not use any tobacco products, including cigarettes, chewing tobacco, or electronic cigarettes. If you need help  quitting, ask your doctor. Contact a doctor if:  You are getting worse, not better.  Your symptoms are not helped by medicine.  You have chills.  You are getting more short of breath.  You have brown or red mucus.  You have yellow or brown discharge from your nose.  You have pain in your face, especially when you bend forward.  You have a fever.  You have puffy (swollen) neck glands.  You have pain while swallowing.  You have white areas in the back of your throat. Get help right away if:  You have very bad or constant: ? Headache. ? Ear pain. ? Pain in your forehead, behind your eyes, and over your cheekbones (sinus pain). ? Chest pain.  You have long-lasting (chronic) lung disease and any of the following: ? Wheezing. ? Long-lasting cough. ? Coughing up blood. ? A change in your usual mucus.  You have a stiff neck.  You have changes in your: ? Vision. ? Hearing. ? Thinking. ? Mood. This information is not intended to replace advice given to you by your health care provider. Make sure you discuss any questions you have with your health care provider. Document Released: 04/15/2008 Document Revised: 06/30/2016 Document Reviewed: 02/02/2014 Elsevier Interactive Patient Education  2018 [employer].   IF you received an x-ray today, you will receive  an Pharmacologist from [facility]. Please contact [facility] at [phone] with questions or concerns regarding your invoice.   IF you received labwork today, you will receive an invoice from [facility]. Please contact [facility] at [phone] with questions or concerns regarding your invoice.   Our billing staff will not be able to assist you with questions regarding bills from these companies.  You will be contacted with the lab results as soon as they are available. The fastest way to get your results is to activate your My Chart account. Instructions are located on the last page of this paperwork. If  you have not heard from [place] regarding the results in 2 weeks, please contact this office.    '

## 2018-09-09 LAB — CULTURE, GROUP A STREP: Strep A Culture: NEGATIVE

## 2018-09-11 ENCOUNTER — Ambulatory Visit: Payer: Self-pay | Admitting: *Deleted

## 2018-09-11 NOTE — Telephone Encounter (Signed)
Summary: Yeast Infection   Pt was seen on 09/07/18 by Tenna Delaine and was prescribed medication which seems to have caused a yeast infection to develop. Pt is requesting Rx for this. Please advise [address], [place] [phone] (Phone) [phone] (Fax)     Left message for pt to return call to office to discuss symptoms.

## 2018-09-11 NOTE — Telephone Encounter (Signed)
Patient called in with c/o "vaginal itching." She says "I have itching and discomfort, no discharge. This started Tuesday, I've used Monitstat OTC and it's not helping at all. I don't have pain, the itching is moderate and uncomfortable." I asked has she ever had yeast infections before, she says "yes and it's the same without the discharge." I asked about other symptoms, she denies. According to protocol, home advice. I advised I will send to [place] for her recommendation and someone from the office will call her back, she verbalized understanding.    Reason for Disposition . [1] Symptoms of a yeast infection (i.e., itchy, white discharge, not bad smelling) AND    [2] feels like prior vaginal yeast infections  Answer Assessment - Initial Assessment Questions 1. SYMPTOM: "What's the main symptom you're concerned about?" (e.g., pain, itching, dryness)     Itching, discomfort 2. LOCATION: "Where is the itching located?" (e.g., inside/outside, left/right)     Inside 3. ONSET: "When did the itching start?"     Tuesday night 4. PAIN: "Is there any pain?" If so, ask: "How bad is it?" (Scale: 1-10; mild, moderate, severe)     No 5. ITCHING: "Is there any itching?" If so, ask: "How bad is it?" (Scale: 1-10; mild, moderate, severe)     Moderate 6. CAUSE: "What do you think is causing the discharge?" "Have you had the same problem before? What happened then?"     Yes, yeast infection, a while ago 7. OTHER SYMPTOMS: "Do you have any other symptoms?" (e.g., fever, itching, vaginal bleeding, pain with urination, injury to genital area, vaginal foreign body)     No 8. PREGNANCY: "Is there any chance you are pregnant?" "When was your last menstrual period?"     No  Protocols used: VAGINAL [facility]

## 2018-09-15 ENCOUNTER — Telehealth: Payer: Self-pay | Admitting: *Deleted

## 2018-09-15 NOTE — Telephone Encounter (Signed)
LMOM for pt to call the office, if symptoms are no better, advised office visit.

## 2019-06-04 ENCOUNTER — Telehealth: Payer: 59 | Admitting: Family

## 2019-06-04 DIAGNOSIS — R12 Heartburn: Secondary | ICD-10-CM

## 2019-06-04 MED ORDER — OMEPRAZOLE 20 MG PO CPDR
20.0000 mg | DELAYED_RELEASE_CAPSULE | Freq: Every day | ORAL | 3 refills | Status: DC
Start: 2019-06-04 — End: 2023-01-09

## 2019-06-04 NOTE — Progress Notes (Signed)
We are sorry that you are not feeling well.  Here is how we plan to help!  Based on what you shared with me it looks like you most likely have Gastroesophageal Reflux Disease (GERD)  Gastroesophageal reflux disease (GERD) happens when acid from your stomach flows up into the esophagus.  When acid comes in contact with the esophagus, the acid causes sorenss (inflammation) in the esophagus.  Over time, GERD may create small holes (ulcers) in the lining of the esophagus.  I have prescribed Omeprazole, a Protein Pump inhibitor, 20 mg daily until you follow up with a provider.  Your symptoms should improve in the next day or two.  You can use antacids as needed until symptoms resolve.  Call us if your heartburn worsens, you have trouble swallowing, weight loss, spitting up blood or recurrent vomiting.  Home Care:  May include lifestyle changes such as weight loss, quitting smoking and alcohol consumption  Avoid foods and drinks that make your symptoms worse, such as:  Caffeine or alcoholic drinks  Chocolate  Peppermint or mint flavorings  Garlic and onions  Spicy foods  Citrus fruits, such as oranges, lemons, or limes  Tomato-based foods such as sauce, chili, salsa and pizza  Fried and fatty foods  Avoid lying down for 3 hours prior to your bedtime or prior to taking a nap  Eat small, frequent meals instead of a large meals  Wear loose-fitting clothing.  Do not wear anything tight around your waist that causes pressure on your stomach.  Raise the head of your bed 6 to 8 inches with wood blocks to help you sleep.  Extra pillows will not help.  Seek Help Right Away If:  You have pain in your arms, neck, jaw, teeth or back  Your pain increases or changes in intensity or duration  You develop nausea, vomiting or sweating (diaphoresis)  You develop shortness of breath or you faint  Your vomit is green, yellow, black or looks like coffee grounds or blood  Your stool is red,  bloody or black  These symptoms could be signs of other problems, such as heart disease, gastric bleeding or esophageal bleeding.  Make sure you :  Understand these instructions.  Will watch your condition.  Will get help right away if you are not doing well or get worse.  Your e-visit answers were reviewed by a board certified advanced clinical practitioner to complete your personal care plan.  Depending on the condition, your plan could have included both over the counter or prescription medications.  If there is a problem please reply  once you have received a response from your provider.  Your safety is important to [place].  If you have drug allergies check your prescription carefully.    You can use MyChart to ask questions about today's visit, request a non-urgent call back, or ask for a work or school excuse for 24 hours related to this e-Visit. If it has been greater than 24 hours you will need to follow up with your provider, or enter a new e-Visit to address those concerns.  You will get an e-mail in the next two days asking about your experience.  I hope that your e-visit has been valuable and will speed your recovery. Thank you for using e-visits.  Greater than 5 minutes, yet less than 10 minutes of time have been spent researching, coordinating, and implementing care for this patient today.  Thank you for the details you included in the comment boxes.  Those details are very helpful in determining the best course of treatment for you and help [place] to provide the best care.

## 2019-06-05 ENCOUNTER — Emergency Department (HOSPITAL_BASED_OUTPATIENT_CLINIC_OR_DEPARTMENT_OTHER): Payer: 59

## 2019-06-05 ENCOUNTER — Emergency Department (HOSPITAL_BASED_OUTPATIENT_CLINIC_OR_DEPARTMENT_OTHER)
Admission: EM | Admit: 2019-06-05 | Discharge: 2019-06-05 | Disposition: A | Discharge status: Home / Self Care | Payer: 59 | Attending: Emergency Medicine | Admitting: Emergency Medicine

## 2019-06-05 ENCOUNTER — Encounter (HOSPITAL_BASED_OUTPATIENT_CLINIC_OR_DEPARTMENT_OTHER): Payer: Self-pay | Admitting: *Deleted

## 2019-06-05 ENCOUNTER — Other Ambulatory Visit: Payer: Self-pay

## 2019-06-05 DIAGNOSIS — K7689 Other specified diseases of liver: Secondary | ICD-10-CM | POA: Diagnosis not present

## 2019-06-05 DIAGNOSIS — R1013 Epigastric pain: Secondary | ICD-10-CM | POA: Diagnosis not present

## 2019-06-05 DIAGNOSIS — R110 Nausea: Secondary | ICD-10-CM

## 2019-06-05 DIAGNOSIS — R1011 Right upper quadrant pain: Secondary | ICD-10-CM | POA: Diagnosis not present

## 2019-06-05 DIAGNOSIS — N839 Noninflammatory disorder of ovary, fallopian tube and broad ligament, unspecified: Secondary | ICD-10-CM | POA: Diagnosis not present

## 2019-06-05 DIAGNOSIS — R0789 Other chest pain: Secondary | ICD-10-CM | POA: Diagnosis present

## 2019-06-05 DIAGNOSIS — N838 Other noninflammatory disorders of ovary, fallopian tube and broad ligament: Secondary | ICD-10-CM

## 2019-06-05 DIAGNOSIS — R197 Diarrhea, unspecified: Secondary | ICD-10-CM | POA: Diagnosis not present

## 2019-06-05 DIAGNOSIS — N83201 Unspecified ovarian cyst, right side: Secondary | ICD-10-CM | POA: Diagnosis not present

## 2019-06-05 DIAGNOSIS — R079 Chest pain, unspecified: Secondary | ICD-10-CM | POA: Diagnosis not present

## 2019-06-05 DIAGNOSIS — K689 Other disorders of retroperitoneum: Secondary | ICD-10-CM | POA: Diagnosis not present

## 2019-06-05 LAB — CBC WITH DIFFERENTIAL/PLATELET
Abs Immature Granulocytes: 0.01 10*3/uL (ref 0.00–0.07)
Basophils Absolute: 0 10*3/uL (ref 0.0–0.1)
Basophils Relative: 1 %
Eosinophils Absolute: 0.2 10*3/uL (ref 0.0–0.5)
Eosinophils Relative: 4 %
HCT: 39.9 % (ref 36.0–46.0)
Hemoglobin: 12.7 g/dL (ref 12.0–15.0)
Immature Granulocytes: 0 %
Lymphocytes Relative: 41 %
Lymphs Abs: 2.5 10*3/uL (ref 0.7–4.0)
MCH: 28.2 pg (ref 26.0–34.0)
MCHC: 31.8 g/dL (ref 30.0–36.0)
MCV: 88.5 fL (ref 80.0–100.0)
Monocytes Absolute: 0.4 10*3/uL (ref 0.1–1.0)
Monocytes Relative: 7 %
Neutro Abs: 2.9 10*3/uL (ref 1.7–7.7)
Neutrophils Relative %: 47 %
Platelets: 430 10*3/uL — ABNORMAL HIGH (ref 150–400)
RBC: 4.51 MIL/uL (ref 3.87–5.11)
RDW: 13.2 % (ref 11.5–15.5)
WBC: 6.1 10*3/uL (ref 4.0–10.5)
nRBC: 0 % (ref 0.0–0.2)

## 2019-06-05 LAB — URINALYSIS, MICROSCOPIC (REFLEX): WBC, UA: NONE SEEN WBC/hpf (ref 0–5)

## 2019-06-05 LAB — URINALYSIS, ROUTINE W REFLEX MICROSCOPIC
Bilirubin Urine: NEGATIVE
Glucose, UA: NEGATIVE mg/dL
Ketones, ur: NEGATIVE mg/dL
Leukocytes,Ua: NEGATIVE
Nitrite: NEGATIVE
Protein, ur: NEGATIVE mg/dL
Specific Gravity, Urine: 1.02 (ref 1.005–1.030)
pH: 8 (ref 5.0–8.0)

## 2019-06-05 LAB — COMPREHENSIVE METABOLIC PANEL
ALT: 24 U/L (ref 0–44)
AST: 27 U/L (ref 15–41)
Albumin: 4 g/dL (ref 3.5–5.0)
Alkaline Phosphatase: 56 U/L (ref 38–126)
Anion gap: 10 (ref 5–15)
BUN: 9 mg/dL (ref 6–20)
CO2: 23 mmol/L (ref 22–32)
Calcium: 8.7 mg/dL — ABNORMAL LOW (ref 8.9–10.3)
Chloride: 106 mmol/L (ref 98–111)
Creatinine, Ser: 0.86 mg/dL (ref 0.44–1.00)
GFR calc Af Amer: >60 mL/min (ref >60)
GFR calc non Af Amer: >60 mL/min (ref >60)
Glucose, Bld: 95 mg/dL (ref 70–99)
Potassium: 3.7 mmol/L (ref 3.5–5.1)
Sodium: 139 mmol/L (ref 135–145)
Total Bilirubin: 0.5 mg/dL (ref 0.3–1.2)
Total Protein: 7.4 g/dL (ref 6.5–8.1)

## 2019-06-05 LAB — TROPONIN I (HIGH SENSITIVITY): Troponin I (High Sensitivity): 2 ng/L (ref <18)

## 2019-06-05 LAB — PREGNANCY, URINE: Preg Test, Ur: NEGATIVE

## 2019-06-05 LAB — LIPASE, BLOOD: Lipase: 30 U/L (ref 11–51)

## 2019-06-05 MED ORDER — FENTANYL CITRATE (PF) 100 MCG/2ML IJ SOLN
50.0000 ug | Freq: Once | INTRAMUSCULAR | Status: AC
  Administered 2019-06-05: 50 ug via INTRAVENOUS
  Filled 2019-06-05: qty 2

## 2019-06-05 MED ORDER — IOHEXOL 300 MG/ML  SOLN
100.0000 mL | Freq: Once | INTRAMUSCULAR | Status: AC | PRN
  Administered 2019-06-05: 100 mL via INTRAVENOUS

## 2019-06-05 MED ORDER — SODIUM CHLORIDE 0.9 % IV BOLUS
1000.0000 mL | Freq: Once | INTRAVENOUS | Status: AC
  Administered 2019-06-05: 1000 mL via INTRAVENOUS

## 2019-06-05 MED ORDER — SUCRALFATE 1 G PO TABS: 1.0000 g | ORAL_TABLET | Freq: Three times a day (TID) | ORAL | 0 refills | Status: DC

## 2019-06-05 MED ORDER — ONDANSETRON 4 MG PO TBDP: ORAL_TABLET | ORAL | 0 refills | Status: DC

## 2019-06-05 MED ORDER — ALUM & MAG HYDROXIDE-SIMETH 200-200-20 MG/5ML PO SUSP
30.0000 mL | Freq: Once | ORAL | Status: AC
  Administered 2019-06-05: 30 mL via ORAL
  Filled 2019-06-05: qty 30

## 2019-06-05 MED ORDER — FAMOTIDINE IN NACL 20-0.9 MG/50ML-% IV SOLN
20.0000 mg | Freq: Once | INTRAVENOUS | Status: AC
  Administered 2019-06-05: 20 mg via INTRAVENOUS
  Filled 2019-06-05: qty 50

## 2019-06-05 MED ORDER — LIDOCAINE VISCOUS HCL 2 % MT SOLN
15.0000 mL | Freq: Once | OROMUCOSAL | Status: AC
  Administered 2019-06-05: 15 mL via ORAL
  Filled 2019-06-05: qty 15

## 2019-06-05 MED ORDER — ONDANSETRON HCL 4 MG/2ML IJ SOLN
4.0000 mg | Freq: Once | INTRAMUSCULAR | Status: AC
  Administered 2019-06-05: 4 mg via INTRAVENOUS
  Filled 2019-06-05: qty 2

## 2019-06-05 MED ORDER — TRAMADOL HCL 50 MG PO TABS: 50.0000 mg | ORAL_TABLET | Freq: Four times a day (QID) | ORAL | 0 refills | Status: DC | PRN

## 2019-06-05 NOTE — Discharge Instructions (Addendum)
Stay hydrated. 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 You will need to see your GYN doctor to get a Pap smear and dedicated ultrasound.  You have a cyst in your liver and you may have a stomach ulcer.  Please take Carafate as well.  Please call GI for possible endoscopy  See your primary care doctor in a week   Return to ER if you have worse abdominal pain, vomiting, dehydration.

## 2019-06-05 NOTE — ED Notes (Signed)
Pt providing urine sample.

## 2019-06-05 NOTE — ED Notes (Signed)
ED Provider at bedside. 

## 2019-06-05 NOTE — ED Notes (Signed)
Patient transported to Ultrasound 

## 2019-06-05 NOTE — ED Triage Notes (Signed)
Pt reports 1 week of mid chest pain "burning" with belching

## 2019-06-05 NOTE — ED Provider Notes (Signed)
[facility] EMERGENCY DEPARTMENT Provider Note   CSN: [id number] Arrival date & time: 06/05/19  1324    History   Chief Complaint Chief Complaint  Patient presents with   Chest Pain    HPI [name] is a 38 y.o. female history of gastritis here presenting with epigastric pain, chest pain.  Patient states that for the last week or so, she has been having mid epigastric pain and burning sensation in her throat and her chest.  Patient states that pain is worse at night and keeps her up.  Patient denies eating spicy food or fatty food.  Patient saw her doctor yesterday and was diagnosed with reflux and started on Prilosec.  Patient states that she took a dose of Prilosec and had worsening epigastric cramps so came to the ER for evaluation. Denies any SOB or fever or cough or COVID contacts. No known CAD and no recent travel.      The history is provided by the patient.    History reviewed. No pertinent past medical history.  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      Home Medications    Prior to Admission medications   Medication Sig Start Date End Date Taking? Authorizing Provider  omeprazole (PRILOSEC) 20 MG capsule Take 1 capsule (20 mg total) by mouth daily. 06/04/19  Yes Dutch Quint B, FNP  phentermine 30 MG capsule Take 30 mg by mouth every morning.   Yes [provider]  Benzocaine-Menthol 15-3.6 MG LOZG Use as directed 1 lozenge in the mouth or throat every 4 (four) hours. 09/07/18   Tenna Delaine D, PA-C    benzonatate (TESSALON) 100 MG capsule Take 1-2 capsules (100-200 mg total) by mouth 3 (three) times daily as needed for cough. 09/07/18   Tenna Delaine D, PA-C  Dextromethorphan-guaiFENesin (MUCINEX DM MAXIMUM STRENGTH) 60-1200 MG TB12 Take 1 tablet by mouth every 12 (twelve) hours. 09/07/18   Tenna Delaine D, PA-C  ipratropium (ATROVENT) 0.03 % nasal spray Place 2 sprays into both nostrils 2 (two) times daily. 09/07/18   Tenna Delaine D, PA-C  naproxen (NAPROSYN) 500 MG tablet Take 1 tablet (500 mg total) by mouth 2 (two) times daily with a meal. Patient not taking: Reported on 09/07/2018 03/18/18   Tereasa Coop, PA-C  estradiol (ESTRACE) 1 MG tablet Take 1 mg by mouth 2 (two) times daily.  12/29/14  [provider]  progesterone (PROMETRIUM) 200 MG capsule Take 400 mg by mouth at bedtime.  12/29/14  [provider]    Family History Family History  Problem Relation Age of Onset   Hypertension Mother    Hypertension Father     Social History Social History   Tobacco Use   Smoking status: Never Smoker   Smokeless tobacco: Never Used  Substance Use Topics   Alcohol use: Not Currently    Comment: denies   Drug use: No     Allergies   Codeine, Hydrocodone, and Penicillins   Review of Systems Review  of Systems  Cardiovascular: Positive for chest pain.  Gastrointestinal: Positive for abdominal pain.  All other systems reviewed and are negative.    Physical Exam Updated Vital Signs BP (!) 153/101 (BP Location: Left Arm)    Pulse 85    Temp 98.6 F (37 C) (Oral)    Resp 18    Ht 5\' 4"  (1.626 m)    Wt 104.3 kg    LMP 05/22/2019 (Approximate)    SpO2 100%    BMI 39.48 kg/m   Physical Exam Vitals signs and nursing note reviewed.  HENT:     Head: Normocephalic.  Eyes:     Pupils: Pupils are equal, round, and reactive to light.  Neck:     Musculoskeletal: Normal range of motion.  Cardiovascular:     Rate and Rhythm: Normal rate and  regular rhythm.     Heart sounds: Normal heart sounds.  Pulmonary:     Effort: Pulmonary effort is normal.  Abdominal:     Palpations: Abdomen is soft.     Comments: + epigastric tenderness, mild RUQ tenderness, ? Murphy   Musculoskeletal: Normal range of motion.  Skin:    General: Skin is warm.     Capillary Refill: Capillary refill takes less than 2 seconds.  Neurological:     General: No focal deficit present.     Mental Status: She is alert and oriented to person, place, and time.  Psychiatric:        Mood and Affect: Mood normal.        Behavior: Behavior normal.      ED Treatments / Results  Labs (all labs ordered are listed, but only abnormal results are displayed) Labs Reviewed  CBC WITH DIFFERENTIAL/PLATELET - Abnormal; Notable for the following components:      Result Value   Platelets 430 (*)    All other components within normal limits  COMPREHENSIVE METABOLIC PANEL - Abnormal; Notable for the following components:   Calcium 8.7 (*)    All other components within normal limits  URINALYSIS, ROUTINE W REFLEX MICROSCOPIC - Abnormal; Notable for the following components:   Hgb urine dipstick TRACE (*)    All other components within normal limits  URINALYSIS, MICROSCOPIC (REFLEX) - Abnormal; Notable for the following components:   Bacteria, UA RARE (*)    All other components within normal limits  LIPASE, BLOOD  PREGNANCY, URINE  TROPONIN I (HIGH SENSITIVITY)    EKG EKG Interpretation  Date/Time:  Saturday June 05 2019 13:33:04 EDT Ventricular Rate:  74 PR Interval:  148 QRS Duration: 74 QT Interval:  378 QTC Calculation: 419 R Axis:   79 Text Interpretation:  Normal sinus rhythm Septal infarct , age undetermined Abnormal ECG No old tracing to compare Confirmed by Dorie Rank [phone]) on 06/05/2019 1:37:54 PM   Radiology Dg Chest 2 View  Result Date: 06/05/2019 CLINICAL DATA:  Chest pain, nausea, and belching for 1 week. EXAM: CHEST - 2 VIEW COMPARISON:   None. FINDINGS: The heart size and mediastinal contours are within normal limits. Both lungs are clear. The visualized skeletal structures are unremarkable. IMPRESSION: Negative.  No active cardiopulmonary disease. Electronically Signed   By: Marlaine Hind M.D.   On: 06/05/2019 16:14   Ct Abdomen Pelvis W Contrast  Result Date: 06/05/2019 CLINICAL DATA:  UPPER abdominal and LOWER chest pain for 2 days. Diarrhea. History of diverticulitis. Feels like a burning and belching pain. EXAM: CT ABDOMEN AND PELVIS WITH CONTRAST TECHNIQUE: Multidetector CT imaging of  the abdomen and pelvis was performed using the standard protocol following bolus administration of intravenous contrast. CONTRAST:  167mL OMNIPAQUE IOHEXOL 300 MG/ML  SOLN COMPARISON:  03/15/2011 FINDINGS: Lower chest: Lung bases are clear. Heart size is normal. Hepatobiliary: Stable 9 millimeter mass identified in the posterior segment of the RIGHT hepatic lobe, and consistent with benign lesion. Liver is otherwise unremarkable. The gallbladder is present. Pancreas: Unremarkable. No pancreatic ductal dilatation or surrounding inflammatory changes. Spleen: Normal in size without focal abnormality. Adrenals/Urinary Tract: The adrenal glands are normal in appearance. The kidneys are unremarkable. No evidence for hydronephrosis or ureteral obstruction. The urinary bladder is decompressed but the wall is thickened, measuring 9 millimeters. Stomach/Bowel: The stomach and small bowel loops are normal in appearance. Appendectomy. Colon is unremarkable in appearance. Vascular/Lymphatic: No significant vascular findings are present. No enlarged abdominal or pelvic lymph nodes. Reproductive: Uterus is present and is anteflexed. A RIGHT ovarian oval mass is 4.0 x 3.1 centimeters. LEFT adnexal region is unremarkable. There is no free pelvic fluid. Other: No ascites. Anterior abdominal wall is unremarkable. Musculoskeletal: No acute or significant osseous findings.  IMPRESSION: 1. No evidence for acute abnormality of the abdomen or pelvis. 2. RIGHT ovarian mass measuring 4.0 x 3.1 cm. Further evaluation with pelvic ultrasound is recommended. 3. Thickened bladder wall.  Question of cystitis. 4. Status post appendectomy. 5. Benign 9 millimeter lesion in the RIGHT hepatic lobe. Electronically Signed   By: Nolon Nations M.D.   On: 06/05/2019 17:27   US Abdomen Limited Ruq  Result Date: 06/05/2019 CLINICAL DATA:  Right upper quadrant pain and nausea for 1 week. EXAM: ULTRASOUND ABDOMEN LIMITED RIGHT UPPER QUADRANT COMPARISON:  03/31/2015 FINDINGS: Gallbladder: No gallstones or wall thickening visualized. No sonographic Murphy sign noted by sonographer. Common bile duct: Diameter: 5 mm, within normal limits. Liver: No focal lesion identified. Within normal limits in parenchymal echogenicity. Portal vein is patent on color Doppler imaging with normal direction of blood flow towards the liver. IMPRESSION: Negative.  No hepatobiliary abnormality identified. Electronically Signed   By: Marlaine Hind M.D.   On: 06/05/2019 16:14    Procedures Procedures (including critical care time)  Medications Ordered in ED Medications  ondansetron (ZOFRAN) injection 4 mg (has no administration in time range)  famotidine (PEPCID) IVPB 20 mg premix (0 mg Intravenous Stopped 06/05/19 1604)  sodium chloride 0.9 % bolus 1,000 mL (0 mLs Intravenous Stopped 06/05/19 1634)  alum & mag hydroxide-simeth (MAALOX/MYLANTA) 200-200-20 MG/5ML suspension 30 mL (30 mLs Oral Given 06/05/19 1632)    And  lidocaine (XYLOCAINE) 2 % viscous mouth solution 15 mL (15 mLs Oral Given 06/05/19 1632)  fentaNYL (SUBLIMAZE) injection 50 mcg (50 mcg Intravenous Given 06/05/19 1632)  iohexol (OMNIPAQUE) 300 MG/ML solution 100 mL (100 mLs Intravenous Contrast Given 06/05/19 1655)     Initial Impression / Assessment and Plan / ED Course  I have reviewed the triage vital signs and the nursing notes.  Pertinent labs  & imaging results that were available during my care of the patient were reviewed by me and considered in my medical decision making (see chart for details).       KAVYA HAAG is a 38 y.o. female here with abdominal pain, chest pain. Likely reflux vs stomach ulcer vs biliary colic. Low suspicion for ACS and pain for a week so trop x 1 sufficient. Will get labs, RUQ [place], UA, LFTs, Trop x 1, CXR.   5:40 PM  Pain slightly improved with pain medicine.  She felt nauseated was given some Zofran.  Patient's labs including LFTs are normal .  Her right upper quadrant ultrasound is normal.  Since she still has some pain so CT abdomen pelvis was performed.  There is an incidental right ovarian mass measuring about 4 x 3 cm.  Patient had no right lower quadrant tenderness at all and was not complaining of any pelvic pain or vaginal discharge.  There is a small lesion in the right hepatic lobe.  At this point, I recommend that she follows up with her GYN doctor and get a Pap smear and perhaps a dedicated ovarian ultrasound.  I doubt ovarian torsion currently but she may have an ovarian mass that needs to be worked up outpatient.  Her nausea and epigastric pain may be secondary to a stomach ulcer that is not visualized on CT.  She is already on Prilosec and I encouraged her to continue that.  Can add some Pepcid and Carafate as well.  We will also prescribe some pain medicine and nausea medicine and refer her to GI as well.  Final Clinical Impressions(s) / ED Diagnoses   Final diagnoses:  RUQ pain    ED Discharge Orders    None       Drenda Freeze, MD 06/05/19 1742

## 2019-06-06 DIAGNOSIS — H524 Presbyopia: Secondary | ICD-10-CM | POA: Diagnosis not present

## 2019-06-10 DIAGNOSIS — K7689 Other specified diseases of liver: Secondary | ICD-10-CM | POA: Diagnosis not present

## 2019-06-10 DIAGNOSIS — K219 Gastro-esophageal reflux disease without esophagitis: Secondary | ICD-10-CM | POA: Diagnosis not present

## 2019-07-07 DIAGNOSIS — N83209 Unspecified ovarian cyst, unspecified side: Secondary | ICD-10-CM | POA: Diagnosis not present

## 2019-08-31 DIAGNOSIS — Z1231 Encounter for screening mammogram for malignant neoplasm of breast: Secondary | ICD-10-CM | POA: Diagnosis not present

## 2019-08-31 DIAGNOSIS — N83201 Unspecified ovarian cyst, right side: Secondary | ICD-10-CM | POA: Diagnosis not present

## 2019-10-21 DIAGNOSIS — R0981 Nasal congestion: Secondary | ICD-10-CM | POA: Diagnosis not present

## 2019-10-21 DIAGNOSIS — Z7189 Other specified counseling: Secondary | ICD-10-CM | POA: Diagnosis not present

## 2019-10-21 DIAGNOSIS — Z20828 Contact with and (suspected) exposure to other viral communicable diseases: Secondary | ICD-10-CM | POA: Diagnosis not present

## 2019-10-21 DIAGNOSIS — J029 Acute pharyngitis, unspecified: Secondary | ICD-10-CM | POA: Diagnosis not present

## 2019-10-21 DIAGNOSIS — E669 Obesity, unspecified: Secondary | ICD-10-CM | POA: Diagnosis not present

## 2020-03-14 ENCOUNTER — Other Ambulatory Visit: Payer: Self-pay | Admitting: Occupational Medicine

## 2020-03-14 ENCOUNTER — Other Ambulatory Visit: Payer: Self-pay

## 2020-03-14 ENCOUNTER — Ambulatory Visit: Payer: Self-pay

## 2020-03-14 DIAGNOSIS — M25512 Pain in left shoulder: Secondary | ICD-10-CM

## 2020-06-16 DIAGNOSIS — H524 Presbyopia: Secondary | ICD-10-CM | POA: Diagnosis not present

## 2021-02-16 DIAGNOSIS — R059 Cough, unspecified: Secondary | ICD-10-CM | POA: Diagnosis not present

## 2021-02-16 DIAGNOSIS — J3081 Allergic rhinitis due to animal (cat) (dog) hair and dander: Secondary | ICD-10-CM | POA: Diagnosis not present

## 2021-02-16 DIAGNOSIS — J301 Allergic rhinitis due to pollen: Secondary | ICD-10-CM | POA: Diagnosis not present

## 2021-02-16 DIAGNOSIS — J3089 Other allergic rhinitis: Secondary | ICD-10-CM | POA: Diagnosis not present

## 2021-03-27 DIAGNOSIS — J301 Allergic rhinitis due to pollen: Secondary | ICD-10-CM | POA: Diagnosis not present

## 2021-03-28 DIAGNOSIS — J3089 Other allergic rhinitis: Secondary | ICD-10-CM | POA: Diagnosis not present

## 2021-03-28 DIAGNOSIS — J3081 Allergic rhinitis due to animal (cat) (dog) hair and dander: Secondary | ICD-10-CM | POA: Diagnosis not present

## 2021-05-29 ENCOUNTER — Telehealth: Payer: Self-pay | Admitting: Family

## 2021-05-29 NOTE — Telephone Encounter (Signed)
Patient would like to re-establish care with Dr. Etter Sjogren

## 2021-05-29 NOTE — Telephone Encounter (Signed)
[facility] not accepting new patients at this time

## 2021-05-30 NOTE — Telephone Encounter (Signed)
Patient made aware.

## 2021-07-09 DIAGNOSIS — R059 Cough, unspecified: Secondary | ICD-10-CM | POA: Diagnosis not present

## 2021-07-09 DIAGNOSIS — H1045 Other chronic allergic conjunctivitis: Secondary | ICD-10-CM | POA: Diagnosis not present

## 2021-07-09 DIAGNOSIS — J3089 Other allergic rhinitis: Secondary | ICD-10-CM | POA: Diagnosis not present

## 2021-07-09 DIAGNOSIS — J301 Allergic rhinitis due to pollen: Secondary | ICD-10-CM | POA: Diagnosis not present

## 2021-07-09 DIAGNOSIS — J3081 Allergic rhinitis due to animal (cat) (dog) hair and dander: Secondary | ICD-10-CM | POA: Diagnosis not present

## 2021-07-19 DIAGNOSIS — J3089 Other allergic rhinitis: Secondary | ICD-10-CM | POA: Diagnosis not present

## 2021-07-19 DIAGNOSIS — J3081 Allergic rhinitis due to animal (cat) (dog) hair and dander: Secondary | ICD-10-CM | POA: Diagnosis not present

## 2021-07-19 DIAGNOSIS — J301 Allergic rhinitis due to pollen: Secondary | ICD-10-CM | POA: Diagnosis not present

## 2021-08-01 DIAGNOSIS — J3081 Allergic rhinitis due to animal (cat) (dog) hair and dander: Secondary | ICD-10-CM | POA: Diagnosis not present

## 2021-08-01 DIAGNOSIS — J3089 Other allergic rhinitis: Secondary | ICD-10-CM | POA: Diagnosis not present

## 2021-08-01 DIAGNOSIS — J301 Allergic rhinitis due to pollen: Secondary | ICD-10-CM | POA: Diagnosis not present

## 2021-08-03 ENCOUNTER — Other Ambulatory Visit: Payer: Self-pay | Admitting: Occupational Medicine

## 2021-08-03 ENCOUNTER — Ambulatory Visit
Admission: RE | Admit: 2021-08-03 | Discharge: 2021-08-03 | Disposition: A | Discharge status: Home / Self Care | Payer: No Typology Code available for payment source | Source: Ambulatory Visit | Attending: Occupational Medicine | Admitting: Occupational Medicine

## 2021-08-03 DIAGNOSIS — M25532 Pain in left wrist: Secondary | ICD-10-CM

## 2021-08-03 DIAGNOSIS — W010XXA Fall on same level from slipping, tripping and stumbling without subsequent striking against object, initial encounter: Secondary | ICD-10-CM

## 2021-08-08 DIAGNOSIS — J301 Allergic rhinitis due to pollen: Secondary | ICD-10-CM | POA: Diagnosis not present

## 2021-08-08 DIAGNOSIS — J3081 Allergic rhinitis due to animal (cat) (dog) hair and dander: Secondary | ICD-10-CM | POA: Diagnosis not present

## 2021-08-08 DIAGNOSIS — J3089 Other allergic rhinitis: Secondary | ICD-10-CM | POA: Diagnosis not present

## 2021-08-15 DIAGNOSIS — J301 Allergic rhinitis due to pollen: Secondary | ICD-10-CM | POA: Diagnosis not present

## 2021-08-15 DIAGNOSIS — J3089 Other allergic rhinitis: Secondary | ICD-10-CM | POA: Diagnosis not present

## 2021-08-15 DIAGNOSIS — J3081 Allergic rhinitis due to animal (cat) (dog) hair and dander: Secondary | ICD-10-CM | POA: Diagnosis not present

## 2021-09-12 DIAGNOSIS — J3089 Other allergic rhinitis: Secondary | ICD-10-CM | POA: Diagnosis not present

## 2021-09-12 DIAGNOSIS — J3081 Allergic rhinitis due to animal (cat) (dog) hair and dander: Secondary | ICD-10-CM | POA: Diagnosis not present

## 2021-09-12 DIAGNOSIS — J301 Allergic rhinitis due to pollen: Secondary | ICD-10-CM | POA: Diagnosis not present

## 2021-10-09 ENCOUNTER — Telehealth (HOSPITAL_COMMUNITY): Payer: Self-pay | Admitting: Professional

## 2021-10-09 ENCOUNTER — Ambulatory Visit (HOSPITAL_COMMUNITY)
Admission: EM | Admit: 2021-10-09 | Discharge: 2021-10-09 | Disposition: A | Discharge status: Home / Self Care | Payer: 59 | Attending: Psychiatry | Admitting: Psychiatry

## 2021-10-09 ENCOUNTER — Other Ambulatory Visit (HOSPITAL_COMMUNITY): Payer: Self-pay

## 2021-10-09 DIAGNOSIS — F321 Major depressive disorder, single episode, moderate: Secondary | ICD-10-CM | POA: Diagnosis not present

## 2021-10-09 MED ORDER — HYDROXYZINE PAMOATE 25 MG PO CAPS
25.0000 mg | ORAL_CAPSULE | Freq: Four times a day (QID) | ORAL | 0 refills | Status: DC | PRN
  Filled 2021-10-09: qty 42, 11d supply, fill #0

## 2021-10-09 MED ORDER — TRAZODONE HCL 50 MG PO TABS
50.0000 mg | ORAL_TABLET | Freq: Every evening | ORAL | 0 refills | Status: DC | PRN
  Filled 2021-10-09: qty 14, 14d supply, fill #0

## 2021-10-09 NOTE — ED Provider Notes (Signed)
Behavioral Health Urgent Care Medical Screening Exam  Patient Name: [name] MRN: [id number] Date of Evaluation: 10/09/21 Chief Complaint:   Diagnosis:  Final diagnoses:  MDD (major depressive disorder), single episode, moderate ([place])    History of Present illness: [name] is a 40 y.o. female patient presented to [facility] as a walk in accompanied by her spouse with complaints of "I feel so overwhelmed".  [name], 40 y.o., female patient seen face to face by this provider, consulted with Dr. Serafina Mitchell; and chart reviewed on 10/09/21.  On evaluation [name] reports over the past few months she has become overwhelmed.  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  She is also trying to juggle being a wife and mother.  Her grandmother whom she is very close to passed away and was buried this past Saturday 10/06/2021.  She is also taking care of her mother who has medical complications and lives in [place].  Patient is traveling back and forth every other month attempting to care for her mother.  Her father lives in town and she is trying to help him get into substance abuse treatment.  Reports it is all become too much.  Patient is requesting medication management and therapy.  During evaluation [name] is sitting position in no acute distress.  She is tearful throughout the assessment.  She is well-groomed and makes good eye contact.  Her speech is clear, coherent, normal rate and tone.  She is alert/oriented x 4 and cooperative.over the past 2 weeks she has increasingly become more depressed and anxious.  Her mood is congruent. She endorses decreased concentration, feelings of helplessness, guilt, decreased appetite, and decreased sleep.  She only sleeps 4-5 hours per night.  States it is difficult to shut her brain off because she is thinking of her grandmother and wishes she could have been there when she passed.  Reports it is  difficult for her to work due to her anxiety and depression.  Her thought process is coherent and relevant; There is no indication that she is currently responding to internal/external stimuli or experiencing delusional thought content.she denies auditory/visual hallucinations, paranoia and delusional thought.  She denies suicidal/self-harm/homicidal ideation. Patient contracts for safety.  Discussed facility base crisis admission and patient declined.  Discussed PHP and patient agreed.  Referral was made.  Educated patient on trazodone for sleep.  Patient was prescribed 14-day supply trazodone 50 mg p.o. nightly as needed. Educated on Hydroxyzine 25 mg PO TID PRN for anxiety, 14 day supply sent to patients pharmacy.   At this time [name] is educated and verbalizes understanding of mental health resources and other crisis services in the community. She is instructed to call 911 and present to the nearest emergency room should she experience any suicidal/homicidal ideation, auditory/visual/hallucinations, or detrimental worsening of her mental health condition.She was a also advised by Probation officer that she could call the toll-free phone on insurance card to assist with identifying in network counselors.    Psychiatric Specialty Exam  Presentation  General Appearance:Appropriate for Environment; Well Groomed  Eye Contact:Good  Speech:Clear and Coherent; Normal Rate  Speech Volume:Normal  Handedness:Right   Mood and Affect  Mood:Anxious; Depressed  Affect:Tearful; Congruent; Depressed   Thought Process  Thought Processes:Coherent  Descriptions of Associations:Intact  Orientation:Full (Time, Place and Person)  Thought Content:Logical    Hallucinations:None  Ideas of Reference:None  Suicidal Thoughts:No  Homicidal Thoughts:No   Sensorium  Memory:Immediate Good; Recent Good; Remote Good  Judgment:Good  Insight:Good   Executive Functions   Concentration:Good  Attention Span:Good  [place]  Language:Good   Psychomotor Activity  Psychomotor Activity:Normal   Assets  Assets:Communication Skills; Desire for Improvement; Financial Resources/Insurance; Housing; Vocational/Educational; Resilience; Social Support   Sleep  Sleep:Poor  Number of hours: 4   No data recorded  Physical Exam: Physical Exam Vitals and nursing note reviewed.  Constitutional:      General: She is not in acute distress.    Appearance: Normal appearance. She is not ill-appearing.  HENT:     Head: Normocephalic.  Eyes:     General:        Right eye: No discharge.        Left eye: No discharge.     Conjunctiva/sclera: Conjunctivae normal.  Cardiovascular:     Rate and Rhythm: Normal rate.  Pulmonary:     Effort: Pulmonary effort is normal.  Musculoskeletal:        General: Normal range of motion.     Cervical back: Normal range of motion.  Skin:    Coloration: Skin is not jaundiced or pale.  Neurological:     Mental Status: She is alert and oriented to person, place, and time.  Psychiatric:        Attention and Perception: Attention and perception normal.        Mood and Affect: Mood is anxious and depressed. Affect is tearful.        Speech: Speech normal.        Behavior: Behavior normal. Behavior is cooperative.        Thought Content: Thought content normal.        Cognition and Memory: Cognition normal.        Judgment: Judgment normal.   Review of Systems  Constitutional: Negative.   HENT: Negative.    Eyes: Negative.   Respiratory: Negative.    Cardiovascular: Negative.   Gastrointestinal: Negative.   Genitourinary: Negative.   Musculoskeletal: Negative.   Skin: Negative.   Psychiatric/Behavioral:  Positive for depression. The patient is nervous/anxious.   Blood pressure (!) 150/99, pulse 76, temperature 98.2 F (36.8 C), temperature source Oral, resp. rate 18, SpO2 100 %. There is  no height or weight on file to calculate BMI.  Musculoskeletal: Strength & Muscle Tone: within normal limits Gait & Station: normal Patient leans: N/A   [place] MSE Discharge Disposition for Follow up and Recommendations: Based on my evaluation the patient does not appear to have an emergency medical condition and can be discharged with resources and follow up care in outpatient services for Medication Management, Partial Hospitalization Program, Individual Therapy, and Group Therapy  Discharge patient  Referral made for [facility] behavioral health partial hospitalization program, also provided outpatient medication management and therapy services resources.    Patient was prescribed 14-day supply trazodone 50 mg p.o. nightly as needed for sleep and Hydroxyzine 25 mg PO TID PRN for anxiety, 14 day supply sent to patients pharmacy.   No evidence of imminent risk to self or others at present.    Patient does not meet criteria for psychiatric inpatient admission. Discussed crisis plan, support from social network, calling 911, coming to the Emergency Department, and calling Suicide Hotline.   Revonda Humphrey, NP 10/09/2021, 6:00 PM

## 2021-10-09 NOTE — Progress Notes (Signed)
   10/09/21 0900  [facility] Triage Screening (Walk-ins at [facility] only)  What Is the Reason for Your Visit/Call Today? 40 year old female present to [facility] with compliants of unresolved grief, depressive symptoms and just feeling overwhelmed. Report her grandmother passed November 2022 and they buried her saturday 10/06/2021. Report she was extremely close to her grandmother. 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 Additional stressors includes taking care of her mother who has medical complications and lives in [place]. Report she flies back/forth everyother month and attempting to get her  substance abusing dad help. Depressive symptoms guilt, unresolved grief, crying, and decreased sleep. Report she can fall asleep but cannot stay alseep. Denied suicidal/homicidal ideations and denied auditory/visual hallucinations. Denied history of mental health and denied seeing her primary care provider.  How Long Has This Been Causing You Problems? 1 wk - 1 month  Have You Recently Had Any Thoughts About Hurting Yourself? No  Are You Planning to Commit Suicide/Harm Yourself At This time? No  Have you Recently Had Thoughts About [place]? No  Are You Planning To Harm Someone At This Time? No  Are you currently experiencing any auditory, visual or other hallucinations? No  Have You Used Any Alcohol or Drugs in the Past 24 Hours? No  Do you have any current medical co-morbidities that require immediate attention? No  Clinician description of patient physical appearance/behavior: Patient dressed appropriately and cooperative, she's tearful and discusses feelings of guilt.  What Do You Feel Would Help You the Most Today? Medication(s)  If access to [facility] was not available, would you have sought care in the Emergency Department? Yes  Determination of Need Routine (7 days)  Options For Referral Medication Management;Inpatient Hospitalization

## 2021-10-09 NOTE — Discharge Instructions (Addendum)
Patient is instructed prior to discharge to: Take all medications as prescribed by his/her mental healthcare Please contact one of the following facilities to start medication management and therapy services:   [facility] [address]  [place], [place] [address] [phone]   [facility]  [address] [place], [place] [address] [phone]  [facility]  [address] [place], [place] [address] [phone]  [facility]  [address]  [place], [place] [address] [phone]  [facility]  [place], [place] [address] [phone]  [facility]  [address],  [place], [place] [address] [phone] provider.       Report any adverse effects and or reactions from the medicines to his/her outpatient provider promptly. Patient has been instructed & cautioned: To not engage in alcohol and or illegal drug use while on prescription medicines. In the event of worsening symptoms, patient is instructed to call the crisis hotline, 911 and or go to the nearest ED for appropriate evaluation and treatment of symptoms. To follow-up with his/her primary care provider for your other medical issues, concerns and or health care needs.

## 2021-10-09 NOTE — ED Notes (Signed)
Pt discharged with  AVS.  AVS reviewed prior to discharge.  Pt alert, oriented, and ambulatory.  Safety maintained.  °

## 2021-10-15 ENCOUNTER — Telehealth (HOSPITAL_COMMUNITY): Payer: Self-pay

## 2021-10-15 NOTE — BH Assessment (Signed)
Care Management - Follow Up Discharges   Writer attempted to make contact with patient today and was unsuccessful.  Voicemail is full.   Per chart review, patient was provided with outpatient resources.

## 2021-12-13 DIAGNOSIS — Z1329 Encounter for screening for other suspected endocrine disorder: Secondary | ICD-10-CM | POA: Diagnosis not present

## 2021-12-13 DIAGNOSIS — N920 Excessive and frequent menstruation with regular cycle: Secondary | ICD-10-CM | POA: Diagnosis not present

## 2021-12-13 DIAGNOSIS — Z0000 Encounter for general adult medical examination without abnormal findings: Secondary | ICD-10-CM | POA: Diagnosis not present

## 2021-12-13 DIAGNOSIS — Z1231 Encounter for screening mammogram for malignant neoplasm of breast: Secondary | ICD-10-CM | POA: Diagnosis not present

## 2021-12-13 DIAGNOSIS — Z131 Encounter for screening for diabetes mellitus: Secondary | ICD-10-CM | POA: Diagnosis not present

## 2021-12-13 DIAGNOSIS — Z130 Encounter for screening for diseases of the blood and blood-forming organs and certain disorders involving the immune mechanism: Secondary | ICD-10-CM | POA: Diagnosis not present

## 2021-12-13 DIAGNOSIS — Z13220 Encounter for screening for lipoid disorders: Secondary | ICD-10-CM | POA: Diagnosis not present

## 2021-12-13 DIAGNOSIS — Z01419 Encounter for gynecological examination (general) (routine) without abnormal findings: Secondary | ICD-10-CM | POA: Diagnosis not present

## 2021-12-28 DIAGNOSIS — J3089 Other allergic rhinitis: Secondary | ICD-10-CM | POA: Diagnosis not present

## 2021-12-28 DIAGNOSIS — R928 Other abnormal and inconclusive findings on diagnostic imaging of breast: Secondary | ICD-10-CM | POA: Diagnosis not present

## 2021-12-28 DIAGNOSIS — J3081 Allergic rhinitis due to animal (cat) (dog) hair and dander: Secondary | ICD-10-CM | POA: Diagnosis not present

## 2021-12-28 DIAGNOSIS — J301 Allergic rhinitis due to pollen: Secondary | ICD-10-CM | POA: Diagnosis not present

## 2022-01-01 DIAGNOSIS — H524 Presbyopia: Secondary | ICD-10-CM | POA: Diagnosis not present

## 2022-01-02 DIAGNOSIS — J301 Allergic rhinitis due to pollen: Secondary | ICD-10-CM | POA: Diagnosis not present

## 2022-01-02 DIAGNOSIS — J3081 Allergic rhinitis due to animal (cat) (dog) hair and dander: Secondary | ICD-10-CM | POA: Diagnosis not present

## 2022-01-02 DIAGNOSIS — J3089 Other allergic rhinitis: Secondary | ICD-10-CM | POA: Diagnosis not present

## 2022-01-08 DIAGNOSIS — J3081 Allergic rhinitis due to animal (cat) (dog) hair and dander: Secondary | ICD-10-CM | POA: Diagnosis not present

## 2022-01-08 DIAGNOSIS — H1045 Other chronic allergic conjunctivitis: Secondary | ICD-10-CM | POA: Diagnosis not present

## 2022-01-08 DIAGNOSIS — D251 Intramural leiomyoma of uterus: Secondary | ICD-10-CM | POA: Diagnosis not present

## 2022-01-08 DIAGNOSIS — R059 Cough, unspecified: Secondary | ICD-10-CM | POA: Diagnosis not present

## 2022-01-08 DIAGNOSIS — J301 Allergic rhinitis due to pollen: Secondary | ICD-10-CM | POA: Diagnosis not present

## 2022-01-08 DIAGNOSIS — N920 Excessive and frequent menstruation with regular cycle: Secondary | ICD-10-CM | POA: Diagnosis not present

## 2022-01-08 DIAGNOSIS — J3089 Other allergic rhinitis: Secondary | ICD-10-CM | POA: Diagnosis not present

## 2022-01-09 DIAGNOSIS — N971 Female infertility of tubal origin: Secondary | ICD-10-CM | POA: Diagnosis not present

## 2022-01-09 DIAGNOSIS — E288 Other ovarian dysfunction: Secondary | ICD-10-CM | POA: Diagnosis not present

## 2022-01-09 DIAGNOSIS — Z3183 Encounter for assisted reproductive fertility procedure cycle: Secondary | ICD-10-CM | POA: Diagnosis not present

## 2022-01-16 DIAGNOSIS — J301 Allergic rhinitis due to pollen: Secondary | ICD-10-CM | POA: Diagnosis not present

## 2022-01-16 DIAGNOSIS — J3081 Allergic rhinitis due to animal (cat) (dog) hair and dander: Secondary | ICD-10-CM | POA: Diagnosis not present

## 2022-01-16 DIAGNOSIS — J3089 Other allergic rhinitis: Secondary | ICD-10-CM | POA: Diagnosis not present

## 2022-01-21 DIAGNOSIS — J3089 Other allergic rhinitis: Secondary | ICD-10-CM | POA: Diagnosis not present

## 2022-01-21 DIAGNOSIS — J301 Allergic rhinitis due to pollen: Secondary | ICD-10-CM | POA: Diagnosis not present

## 2022-01-21 DIAGNOSIS — J3081 Allergic rhinitis due to animal (cat) (dog) hair and dander: Secondary | ICD-10-CM | POA: Diagnosis not present

## 2022-01-23 DIAGNOSIS — J3089 Other allergic rhinitis: Secondary | ICD-10-CM | POA: Diagnosis not present

## 2022-01-23 DIAGNOSIS — J301 Allergic rhinitis due to pollen: Secondary | ICD-10-CM | POA: Diagnosis not present

## 2022-01-23 DIAGNOSIS — J3081 Allergic rhinitis due to animal (cat) (dog) hair and dander: Secondary | ICD-10-CM | POA: Diagnosis not present

## 2022-01-30 DIAGNOSIS — J3089 Other allergic rhinitis: Secondary | ICD-10-CM | POA: Diagnosis not present

## 2022-01-30 DIAGNOSIS — J3081 Allergic rhinitis due to animal (cat) (dog) hair and dander: Secondary | ICD-10-CM | POA: Diagnosis not present

## 2022-01-30 DIAGNOSIS — J301 Allergic rhinitis due to pollen: Secondary | ICD-10-CM | POA: Diagnosis not present

## 2022-02-01 DIAGNOSIS — J3089 Other allergic rhinitis: Secondary | ICD-10-CM | POA: Diagnosis not present

## 2022-02-01 DIAGNOSIS — J301 Allergic rhinitis due to pollen: Secondary | ICD-10-CM | POA: Diagnosis not present

## 2022-02-01 DIAGNOSIS — J3081 Allergic rhinitis due to animal (cat) (dog) hair and dander: Secondary | ICD-10-CM | POA: Diagnosis not present

## 2022-02-05 DIAGNOSIS — J301 Allergic rhinitis due to pollen: Secondary | ICD-10-CM | POA: Diagnosis not present

## 2022-02-05 DIAGNOSIS — J3081 Allergic rhinitis due to animal (cat) (dog) hair and dander: Secondary | ICD-10-CM | POA: Diagnosis not present

## 2022-02-05 DIAGNOSIS — J3089 Other allergic rhinitis: Secondary | ICD-10-CM | POA: Diagnosis not present

## 2022-02-11 DIAGNOSIS — J3081 Allergic rhinitis due to animal (cat) (dog) hair and dander: Secondary | ICD-10-CM | POA: Diagnosis not present

## 2022-02-11 DIAGNOSIS — J3089 Other allergic rhinitis: Secondary | ICD-10-CM | POA: Diagnosis not present

## 2022-02-11 DIAGNOSIS — J301 Allergic rhinitis due to pollen: Secondary | ICD-10-CM | POA: Diagnosis not present

## 2022-02-19 ENCOUNTER — Telehealth: Payer: Self-pay

## 2022-02-19 ENCOUNTER — Ambulatory Visit: Payer: 59 | Admitting: Family Medicine

## 2022-02-19 NOTE — Telephone Encounter (Signed)
1st no show, fee waived ?

## 2022-02-19 NOTE — Telephone Encounter (Signed)
Patient/Caregiver was notified of No Show/Late Cancellation Policy & possible $79 charge. ?Visit was cancelled with reason "No Show/Cancel within 24 hours" for tracking & charging. ? ?Caller Name: Corby Bennick ?Caller Ph #: [phone] ?Date of APPT: 02/19/22 ?Reason given for no show/late cancellation: none ?No Show Letter printed & put in outgoing mail (Yes/No): yes ? ?~~~Route message to admin supervisor and clinical team/CMA~~~ ? ? ? ?

## 2022-02-20 DIAGNOSIS — J301 Allergic rhinitis due to pollen: Secondary | ICD-10-CM | POA: Diagnosis not present

## 2022-02-20 DIAGNOSIS — J3089 Other allergic rhinitis: Secondary | ICD-10-CM | POA: Diagnosis not present

## 2022-02-20 DIAGNOSIS — J3081 Allergic rhinitis due to animal (cat) (dog) hair and dander: Secondary | ICD-10-CM | POA: Diagnosis not present

## 2022-02-26 DIAGNOSIS — J3081 Allergic rhinitis due to animal (cat) (dog) hair and dander: Secondary | ICD-10-CM | POA: Diagnosis not present

## 2022-02-26 DIAGNOSIS — J301 Allergic rhinitis due to pollen: Secondary | ICD-10-CM | POA: Diagnosis not present

## 2022-02-26 DIAGNOSIS — J3089 Other allergic rhinitis: Secondary | ICD-10-CM | POA: Diagnosis not present

## 2022-03-01 DIAGNOSIS — J3089 Other allergic rhinitis: Secondary | ICD-10-CM | POA: Diagnosis not present

## 2022-03-01 DIAGNOSIS — J301 Allergic rhinitis due to pollen: Secondary | ICD-10-CM | POA: Diagnosis not present

## 2022-03-01 DIAGNOSIS — J3081 Allergic rhinitis due to animal (cat) (dog) hair and dander: Secondary | ICD-10-CM | POA: Diagnosis not present

## 2022-03-05 DIAGNOSIS — J3089 Other allergic rhinitis: Secondary | ICD-10-CM | POA: Diagnosis not present

## 2022-03-05 DIAGNOSIS — J301 Allergic rhinitis due to pollen: Secondary | ICD-10-CM | POA: Diagnosis not present

## 2022-03-05 DIAGNOSIS — J3081 Allergic rhinitis due to animal (cat) (dog) hair and dander: Secondary | ICD-10-CM | POA: Diagnosis not present

## 2022-03-06 ENCOUNTER — Encounter (HOSPITAL_COMMUNITY): Payer: Self-pay

## 2022-03-06 ENCOUNTER — Emergency Department (HOSPITAL_COMMUNITY): Payer: 59

## 2022-03-06 ENCOUNTER — Emergency Department (HOSPITAL_COMMUNITY)
Admission: EM | Admit: 2022-03-06 | Discharge: 2022-03-06 | Discharge status: Against Medical Advice | Payer: 59 | Attending: Emergency Medicine | Admitting: Emergency Medicine

## 2022-03-06 ENCOUNTER — Other Ambulatory Visit: Payer: Self-pay

## 2022-03-06 DIAGNOSIS — R519 Headache, unspecified: Secondary | ICD-10-CM | POA: Diagnosis not present

## 2022-03-06 DIAGNOSIS — Z5321 Procedure and treatment not carried out due to patient leaving prior to being seen by health care provider: Secondary | ICD-10-CM | POA: Insufficient documentation

## 2022-03-06 DIAGNOSIS — R110 Nausea: Secondary | ICD-10-CM | POA: Diagnosis not present

## 2022-03-06 DIAGNOSIS — H5711 Ocular pain, right eye: Secondary | ICD-10-CM | POA: Insufficient documentation

## 2022-03-06 DIAGNOSIS — H538 Other visual disturbances: Secondary | ICD-10-CM | POA: Insufficient documentation

## 2022-03-06 DIAGNOSIS — R112 Nausea with vomiting, unspecified: Secondary | ICD-10-CM | POA: Diagnosis not present

## 2022-03-06 DIAGNOSIS — I10 Essential (primary) hypertension: Secondary | ICD-10-CM | POA: Diagnosis not present

## 2022-03-06 DIAGNOSIS — G4489 Other headache syndrome: Secondary | ICD-10-CM | POA: Diagnosis not present

## 2022-03-06 LAB — BASIC METABOLIC PANEL
Anion gap: 9 (ref 5–15)
BUN: 11 mg/dL (ref 6–20)
CO2: 24 mmol/L (ref 22–32)
Calcium: 9.1 mg/dL (ref 8.9–10.3)
Chloride: 105 mmol/L (ref 98–111)
Creatinine, Ser: 1.05 mg/dL — ABNORMAL HIGH (ref 0.44–1.00)
GFR, Estimated: >60 mL/min (ref >60)
Glucose, Bld: 104 mg/dL — ABNORMAL HIGH (ref 70–99)
Potassium: 3.7 mmol/L (ref 3.5–5.1)
Sodium: 138 mmol/L (ref 135–145)

## 2022-03-06 LAB — CBC WITH DIFFERENTIAL/PLATELET
Abs Immature Granulocytes: 0.01 10*3/uL (ref 0.00–0.07)
Basophils Absolute: 0.1 10*3/uL (ref 0.0–0.1)
Basophils Relative: 1 %
Eosinophils Absolute: 0.3 10*3/uL (ref 0.0–0.5)
Eosinophils Relative: 5 %
HCT: 37.8 % (ref 36.0–46.0)
Hemoglobin: 11.6 g/dL — ABNORMAL LOW (ref 12.0–15.0)
Immature Granulocytes: 0 %
Lymphocytes Relative: 36 %
Lymphs Abs: 2.5 10*3/uL (ref 0.7–4.0)
MCH: 26.4 pg (ref 26.0–34.0)
MCHC: 30.7 g/dL (ref 30.0–36.0)
MCV: 86.1 fL (ref 80.0–100.0)
Monocytes Absolute: 0.6 10*3/uL (ref 0.1–1.0)
Monocytes Relative: 8 %
Neutro Abs: 3.4 10*3/uL (ref 1.7–7.7)
Neutrophils Relative %: 50 %
Platelets: 464 10*3/uL — ABNORMAL HIGH (ref 150–400)
RBC: 4.39 MIL/uL (ref 3.87–5.11)
RDW: 13.6 % (ref 11.5–15.5)
WBC: 6.8 10*3/uL (ref 4.0–10.5)
nRBC: 0 % (ref 0.0–0.2)

## 2022-03-06 LAB — I-STAT BETA HCG BLOOD, ED (MC, WL, AP ONLY): I-stat hCG, quantitative: <5 m[IU]/mL (ref <5)

## 2022-03-06 NOTE — ED Provider Triage Note (Addendum)
Emergency Medicine Provider Triage Evaluation Note ? ?[name] , a 41 y.o. female  was evaluated in triage.  Pt complains of headache.  Reports she had slight headache earlier today that resolved spontaneously.  While at work approximately 15 patient had onset of new headache.  Headache onset was gradual pain progressively worse over time.  Pain is located behind her right eye.  Patient endorses blurred vision to right eye.  Patient states that this is the worst headache she has ever had.  Patient does endorse previous migraines with states that she has never had a headache like this before.  Reports that headache became worse when she was riding in the ambulance and became nauseous and started having vomiting.  Denies any recent falls or injuries. ? ?Denies any recent falls or injuries.  Denies any numbness, weakness, facial asymmetry, dysarthria, diplopia, lightheadedness, dizziness, syncope. ? ?Review of Systems  ?Positive: Headache, nausea, vomiting ?Negative: See above ? ?Physical Exam  ?BP (!) 139/93 (BP Location: Left Arm)   Pulse 85   Temp 98.3 ?F (36.8 ?C) (Oral)   Resp 16   Ht '5\' 4"'$  (1.626 m)   Wt 97.5 kg   SpO2 99%   BMI 36.90 kg/m?  ?Gen:   Awake, no distress   ?Resp:  Normal effort  ?MSK:   Moves extremities without difficulty  ?Other:  Grip strength equal.  +5 strength to bilateral upper and lower extremities.  Sensation to light touch grossly intact to bilateral upper and lower extremities.  No facial asymmetry or dysarthria. ? ?Medical Decision Making  ?Medically screening exam initiated at 3:55 PM.  Appropriate orders placed.  [name] was informed that the remainder of the evaluation will be completed by another provider, this initial triage assessment does not replace that evaluation, and the importance of remaining in the ED until their evaluation is complete. ? ?Due to patient reporting new or worsening headache will obtain noncontrast head CT. ?  ?Loni Beckwith,  PA-C ?03/06/22 1557 ? ?  ?Loni Beckwith, PA-C ?03/06/22 1557 ? ?

## 2022-03-06 NOTE — ED Notes (Signed)
Patient left on own accord °

## 2022-03-06 NOTE — ED Triage Notes (Signed)
BIB EMS from work and complaining of headache and nausea.  Hx migraine headaches.  Right eye blurry vision.  No weakness FAST-  BP 180 palp  167/120  NSR  zOFRAN '4MG'$  IM given by EMS ?

## 2022-03-07 ENCOUNTER — Ambulatory Visit: Payer: 59 | Admitting: Nurse Practitioner

## 2022-03-07 VITALS — BP 140/94 | HR 77 | Temp 97.6°F | Wt 214.0 lb

## 2022-03-07 DIAGNOSIS — G43909 Migraine, unspecified, not intractable, without status migrainosus: Secondary | ICD-10-CM

## 2022-03-07 MED ORDER — SUMATRIPTAN SUCCINATE 25 MG PO TABS: 25.0000 mg | ORAL_TABLET | ORAL | Status: DC | PRN

## 2022-03-07 NOTE — Progress Notes (Cosign Needed)
? ?  Acute Office Visit ?Patient ID: [name], female    DOB: 02/26/1981, 41 y.o.   MRN: [id number] ? ?Chief Complaint  ?Patient presents with  ? Hospitalization Follow-up  ?  Headache ?  ? ? ?Subjective:  ?SUBJECTIVE: ?[name] is a 41 y.o. female who complains of headaches for 2 day(s) which began when she was at work. Description of pain: throbbing pain. Duration of individual headaches: 2 day(s), frequency continuously. Associated symptoms: none today. Pain relief: none. Precipitating factors: patient is aware of none. She denies a history of recent head injury.  ?Prior neurological history: negative for no neurological problems. ?Neurologic Review of Systems - headaches described as throbbing. She does endures a history of headaches with her cycle, however her cycle had passed and this headache came on. It is the worst she has ever experienced. She has not had any n/v/dizziness today. She went to the ED for treatment but was given medication for nausea and a head CT, which was negative.  ? ?Review of Systems  ?Constitutional: Negative.   ?HENT: Negative.    ?Eyes: Negative.   ?Respiratory: Negative.    ?Cardiovascular: Negative.   ?Gastrointestinal: Negative.   ?Neurological:  Positive for headaches.  ? ?Physical Exam ?Constitutional:   ?   General: She is not in acute distress. ?   Appearance: Normal appearance.  ?Cardiovascular:  ?   Rate and Rhythm: Normal rate and regular rhythm.  ?Pulmonary:  ?   Effort: Pulmonary effort is normal.  ?   Breath sounds: Normal breath sounds.  ?Abdominal:  ?   General: Bowel sounds are normal.  ?Neurological:  ?   General: No focal deficit present.  ?   Mental Status: She is alert. She is disoriented.  ? ? ?OBJECTIVE: ?BP (!) 140/94   Pulse 77   Temp 97.6 ?F (36.4 ?C)   Wt 214 lb (97.1 kg)   BMI 36.73 kg/m?  ? ? ?Appearance: alert, well appearing, and in no distress. ?Neurological Exam: alert, oriented, normal speech, no focal findings or movement disorder  noted. ? ?ASSESSMENT: ?migraine - common. ? ?PLAN: ?Recommendations: lie in darkened room and apply cold packs prn for pain, side effect profile discussed in detail, asked to keep headache diary, and patient reassured that neurodiagnostic workup not indicated from benign H & P. ?See orders for this visit as documented in the electronic medical record.  ?  ? ? ?Charleen Kirks, FNP ? ? ?

## 2022-03-08 ENCOUNTER — Other Ambulatory Visit (HOSPITAL_COMMUNITY): Payer: Self-pay

## 2022-03-08 MED ORDER — SUMATRIPTAN SUCCINATE 25 MG PO TABS
25.0000 mg | ORAL_TABLET | ORAL | 1 refills | Status: DC
  Filled 2022-03-08: qty 12, 30d supply, fill #0
  Filled 2022-09-10: qty 12, 30d supply, fill #1

## 2022-03-09 ENCOUNTER — Encounter: Payer: Self-pay | Admitting: Nurse Practitioner

## 2022-03-11 DIAGNOSIS — J301 Allergic rhinitis due to pollen: Secondary | ICD-10-CM | POA: Diagnosis not present

## 2022-03-11 DIAGNOSIS — J3081 Allergic rhinitis due to animal (cat) (dog) hair and dander: Secondary | ICD-10-CM | POA: Diagnosis not present

## 2022-03-11 DIAGNOSIS — J3089 Other allergic rhinitis: Secondary | ICD-10-CM | POA: Diagnosis not present

## 2022-03-13 DIAGNOSIS — J301 Allergic rhinitis due to pollen: Secondary | ICD-10-CM | POA: Diagnosis not present

## 2022-03-13 DIAGNOSIS — J3081 Allergic rhinitis due to animal (cat) (dog) hair and dander: Secondary | ICD-10-CM | POA: Diagnosis not present

## 2022-03-13 DIAGNOSIS — J3089 Other allergic rhinitis: Secondary | ICD-10-CM | POA: Diagnosis not present

## 2022-03-14 ENCOUNTER — Telehealth: Payer: 59 | Admitting: Family Medicine

## 2022-03-14 ENCOUNTER — Other Ambulatory Visit (HOSPITAL_COMMUNITY): Payer: Self-pay

## 2022-03-14 DIAGNOSIS — J069 Acute upper respiratory infection, unspecified: Secondary | ICD-10-CM

## 2022-03-14 MED ORDER — AZELASTINE HCL 0.1 % NA SOLN
2.0000 | Freq: Two times a day (BID) | NASAL | 0 refills | Status: DC
  Filled 2022-03-14: qty 30, 50d supply, fill #0

## 2022-03-14 MED ORDER — BENZONATATE 100 MG PO CAPS
100.0000 mg | ORAL_CAPSULE | Freq: Two times a day (BID) | ORAL | 0 refills | Status: DC | PRN
  Filled 2022-03-14: qty 20, 10d supply, fill #0

## 2022-03-14 NOTE — Progress Notes (Signed)
E-Visit for Upper Respiratory Infection  ? ?We are sorry you are not feeling well.  Here is how we plan to help! ? ?Based on what you have shared with me, it looks like you may have a viral upper respiratory infection.  Upper respiratory infections are caused by a large number of viruses; however, rhinovirus is the most common cause.  ? ?Symptoms vary from person to person, with common symptoms including sore throat, cough, fatigue or lack of energy and feeling of general discomfort.  A low-grade fever of up to 100.4 may present, but is often uncommon.  Symptoms vary however, and are closely related to a person's age or underlying illnesses.  The most common symptoms associated with an upper respiratory infection are nasal discharge or congestion, cough, sneezing, headache and pressure in the ears and face.  These symptoms usually persist for about 3 to 10 days, but can last up to 2 weeks.  It is important to know that upper respiratory infections do not cause serious illness or complications in most cases.   ? ?Upper respiratory infections can be transmitted from person to person, with the most common method of transmission being a person's hands.  The virus is able to live on the skin and can infect other persons for up to 2 hours after direct contact.  Also, these can be transmitted when someone coughs or sneezes; thus, it is important to cover the mouth to reduce this risk.  To keep the spread of the illness at [place], good hand hygiene is very important. ? ?This is an infection that is most likely caused by a virus. There are no specific treatments other than to help you with the symptoms until the infection runs its course.  We are sorry you are not feeling well.  Here is how we plan to help! ? ? ?For nasal congestion, you may use an oral decongestants such as Mucinex D or if you have glaucoma or high blood pressure use plain Mucinex.  Saline nasal spray or nasal drops can help and can safely be used as often as  needed for congestion.  For your congestion, I have prescribed Azelastine nasal spray two sprays in each nostril twice a day ? ?If you do not have a history of heart disease, hypertension, diabetes or thyroid disease, prostate/bladder issues or glaucoma, you may also use Sudafed to treat nasal congestion.  It is highly recommended that you consult with a pharmacist or your primary care physician to ensure this medication is safe for you to take.    ? ?If you have a cough, you may use cough suppressants such as Delsym and Robitussin.  If you have glaucoma or high blood pressure, you can also use Coricidin HBP.   ?For cough I have prescribed for you A prescription cough medication called Tessalon Perles 100 mg. You may take 1-2 capsules every 8 hours as needed for cough ? ?If you have a sore or scratchy throat, use a saltwater gargle- ? to ? teaspoon of salt dissolved in a 4-ounce to 8-ounce glass of warm water.  Gargle the solution for approximately 15-30 seconds and then spit.  It is important not to swallow the solution.  You can also use throat lozenges/cough drops and Chloraseptic spray to help with throat pain or discomfort.  Warm or cold liquids can also be helpful in relieving throat pain. ? ?For headache, pain or general discomfort, you can use Ibuprofen or Tylenol as directed.   ?Some authorities believe  that zinc sprays or the use of Echinacea may shorten the course of your symptoms. ? ? ?HOME CARE ?Only take medications as instructed by your medical team. ?Be sure to drink plenty of fluids. Water is fine as well as fruit juices, sodas and electrolyte beverages. You may want to stay away from caffeine or alcohol. If you are nauseated, try taking small sips of liquids. How do you know if you are getting enough fluid? Your urine should be a pale yellow or almost colorless. ?Get rest. ?Taking a steamy shower or using a humidifier may help nasal congestion and ease sore throat pain. You can place a towel over  your head and breathe in the steam from hot water coming from a faucet. ?Using a saline nasal spray works much the same way. ?Cough drops, hard candies and sore throat lozenges may ease your cough. ?Avoid close contacts especially the very young and the elderly ?Cover your mouth if you cough or sneeze ?Always remember to wash your hands.  ? ?GET HELP RIGHT AWAY IF: ?You develop worsening fever. ?If your symptoms do not improve within 10 days ?You develop yellow or green discharge from your nose over 3 days. ?You have coughing fits ?You develop a severe head ache or visual changes. ?You develop shortness of breath, difficulty breathing or start having chest pain ?Your symptoms persist after you have completed your treatment plan ? ?MAKE SURE YOU  ?Understand these instructions. ?Will watch your condition. ?Will get help right away if you are not doing well or get worse. ? ?Thank you for choosing an e-visit. ? ?Your e-visit answers were reviewed by a board certified advanced clinical practitioner to complete your personal care plan. Depending upon the condition, your plan could have included both over the counter or prescription medications. ? ?Please review your pharmacy choice. Make sure the pharmacy is open so you can pick up prescription now. If there is a problem, you may contact your provider through [employer] and have the prescription routed to another pharmacy.  Your safety is important to [place]. If you have drug allergies check your prescription carefully.  ? ?For the next 24 hours you can use MyChart to ask questions about today's visit, request a non-urgent call back, or ask for a work or school excuse. ?You will get an email in the next two days asking about your experience. I hope that your e-visit has been valuable and will speed your recovery. ? ? ? ?I provided 5 minutes of non face-to-face time during this encounter for chart review, medication and order placement, as well as and documentation.   ? ?

## 2022-03-16 ENCOUNTER — Telehealth: Payer: Self-pay | Admitting: Urgent Care

## 2022-03-16 DIAGNOSIS — J0100 Acute maxillary sinusitis, unspecified: Secondary | ICD-10-CM

## 2022-03-16 MED ORDER — DOXYCYCLINE HYCLATE 100 MG PO TABS: 100.0000 mg | ORAL_TABLET | Freq: Two times a day (BID) | ORAL | 0 refills | Status: AC

## 2022-03-16 NOTE — Progress Notes (Signed)
E-Visit for Sinus Problems ?  ?We are sorry that you are not feeling well.  Here is how we plan to help! ?  ?Based on what you have shared with me it looks like you have sinusitis.  Sinusitis is inflammation and infection in the sinus cavities of the head.  Based on your presentation I believe you most likely have Acute Bacterial Sinusitis.  This is an infection caused by bacteria and is treated with antibiotics. I have prescribed Doxycycline '100mg'$  by mouth twice a day for 10 days. You may use an oral decongestant such as Mucinex D or if you have glaucoma or high blood pressure use plain Mucinex. Saline nasal spray help and can safely be used as often as needed for congestion.  If you develop worsening sinus pain, fever or notice severe headache and vision changes, or if symptoms are not better after completion of antibiotic, please schedule an appointment with a health care provider.   ?  ?Sinus infections are not as easily transmitted as other respiratory infection, however we still recommend that you avoid close contact with loved ones, especially the very young and elderly.  Remember to wash your hands thoroughly throughout the day as this is the number one way to prevent the spread of infection! ?  ?Home Care: ?Only take medications as instructed by your medical team. ?Complete the entire course of an antibiotic. ?Do not take these medications with alcohol. ?A steam or ultrasonic humidifier can help congestion.  You can place a towel over your head and breathe in the steam from hot water coming from a faucet. ?Avoid close contacts especially the very young and the elderly. ?Cover your mouth when you cough or sneeze. ?Always remember to wash your hands. ?  ?Get Help Right Away If: ?You develop worsening fever or sinus pain. ?You develop a severe head ache or visual changes. ?Your symptoms persist after you have completed your treatment plan. ?  ?Make sure you ?Understand these instructions. ?Will watch your  condition. ?Will get help right away if you are not doing well or get worse. ?  ?Thank you for choosing an e-visit. ?  ?Your e-visit answers were reviewed by a board certified advanced clinical practitioner to complete your personal care plan. Depending upon the condition, your plan could have included both over the counter or prescription medications. ?  ?Please review your pharmacy choice. Make sure the pharmacy is open so you can pick up prescription now. If there is a problem, you may contact your provider through [employer] and have the prescription routed to another pharmacy.  Your safety is important to [place]. If you have drug allergies check your prescription carefully.  ?  ?For the next 24 hours you can use MyChart to ask questions about today's visit, request a non-urgent call back, or ask for a work or school excuse. ?You will get an email in the next two days asking about your experience. I hope that your e-visit has been valuable and will speed your recovery.  ?  ?I have spent 5 minutes in review of e-visit questionnaire, review and updating patient chart, medical decision making and response to patient.  ? ?Trinda Harlacher L Tocara Mennen, PA ? ?  ?

## 2022-03-18 DIAGNOSIS — J3081 Allergic rhinitis due to animal (cat) (dog) hair and dander: Secondary | ICD-10-CM | POA: Diagnosis not present

## 2022-03-18 DIAGNOSIS — J301 Allergic rhinitis due to pollen: Secondary | ICD-10-CM | POA: Diagnosis not present

## 2022-03-18 DIAGNOSIS — J3089 Other allergic rhinitis: Secondary | ICD-10-CM | POA: Diagnosis not present

## 2022-03-20 ENCOUNTER — Telehealth: Payer: 59 | Admitting: Physician Assistant

## 2022-03-20 DIAGNOSIS — B379 Candidiasis, unspecified: Secondary | ICD-10-CM

## 2022-03-20 MED ORDER — FLUCONAZOLE 150 MG PO TABS: 150.0000 mg | ORAL_TABLET | Freq: Once | ORAL | 0 refills | Status: AC

## 2022-03-20 NOTE — Progress Notes (Signed)
I have spent 5 minutes in review of e-visit questionnaire, review and updating patient chart, medical decision making and response to patient.   Penda Venturi Cody Dorotha Hirschi, PA-C    

## 2022-03-20 NOTE — Progress Notes (Signed)

## 2022-03-22 DIAGNOSIS — J3081 Allergic rhinitis due to animal (cat) (dog) hair and dander: Secondary | ICD-10-CM | POA: Diagnosis not present

## 2022-03-22 DIAGNOSIS — J3089 Other allergic rhinitis: Secondary | ICD-10-CM | POA: Diagnosis not present

## 2022-03-22 DIAGNOSIS — J301 Allergic rhinitis due to pollen: Secondary | ICD-10-CM | POA: Diagnosis not present

## 2022-03-28 DIAGNOSIS — J3089 Other allergic rhinitis: Secondary | ICD-10-CM | POA: Diagnosis not present

## 2022-03-28 DIAGNOSIS — J301 Allergic rhinitis due to pollen: Secondary | ICD-10-CM | POA: Diagnosis not present

## 2022-03-28 DIAGNOSIS — J3081 Allergic rhinitis due to animal (cat) (dog) hair and dander: Secondary | ICD-10-CM | POA: Diagnosis not present

## 2022-04-04 DIAGNOSIS — J3081 Allergic rhinitis due to animal (cat) (dog) hair and dander: Secondary | ICD-10-CM | POA: Diagnosis not present

## 2022-04-04 DIAGNOSIS — J301 Allergic rhinitis due to pollen: Secondary | ICD-10-CM | POA: Diagnosis not present

## 2022-04-04 DIAGNOSIS — J3089 Other allergic rhinitis: Secondary | ICD-10-CM | POA: Diagnosis not present

## 2022-04-12 DIAGNOSIS — Z3189 Encounter for other procreative management: Secondary | ICD-10-CM | POA: Diagnosis not present

## 2022-04-17 DIAGNOSIS — J3089 Other allergic rhinitis: Secondary | ICD-10-CM | POA: Diagnosis not present

## 2022-04-17 DIAGNOSIS — J3081 Allergic rhinitis due to animal (cat) (dog) hair and dander: Secondary | ICD-10-CM | POA: Diagnosis not present

## 2022-04-17 DIAGNOSIS — J301 Allergic rhinitis due to pollen: Secondary | ICD-10-CM | POA: Diagnosis not present

## 2022-04-24 DIAGNOSIS — J3081 Allergic rhinitis due to animal (cat) (dog) hair and dander: Secondary | ICD-10-CM | POA: Diagnosis not present

## 2022-04-24 DIAGNOSIS — J301 Allergic rhinitis due to pollen: Secondary | ICD-10-CM | POA: Diagnosis not present

## 2022-04-24 DIAGNOSIS — J3089 Other allergic rhinitis: Secondary | ICD-10-CM | POA: Diagnosis not present

## 2022-05-08 DIAGNOSIS — J3081 Allergic rhinitis due to animal (cat) (dog) hair and dander: Secondary | ICD-10-CM | POA: Diagnosis not present

## 2022-05-08 DIAGNOSIS — J3089 Other allergic rhinitis: Secondary | ICD-10-CM | POA: Diagnosis not present

## 2022-05-08 DIAGNOSIS — J301 Allergic rhinitis due to pollen: Secondary | ICD-10-CM | POA: Diagnosis not present

## 2022-05-16 IMAGING — CR DG WRIST COMPLETE 3+V*L*
4 series · 4 of 4 positions shown · non-contrast
Comparison: None.

CLINICAL DATA: Fall.

EXAM:
LEFT WRIST - COMPLETE 3+ VIEW

[x wrist pa left]
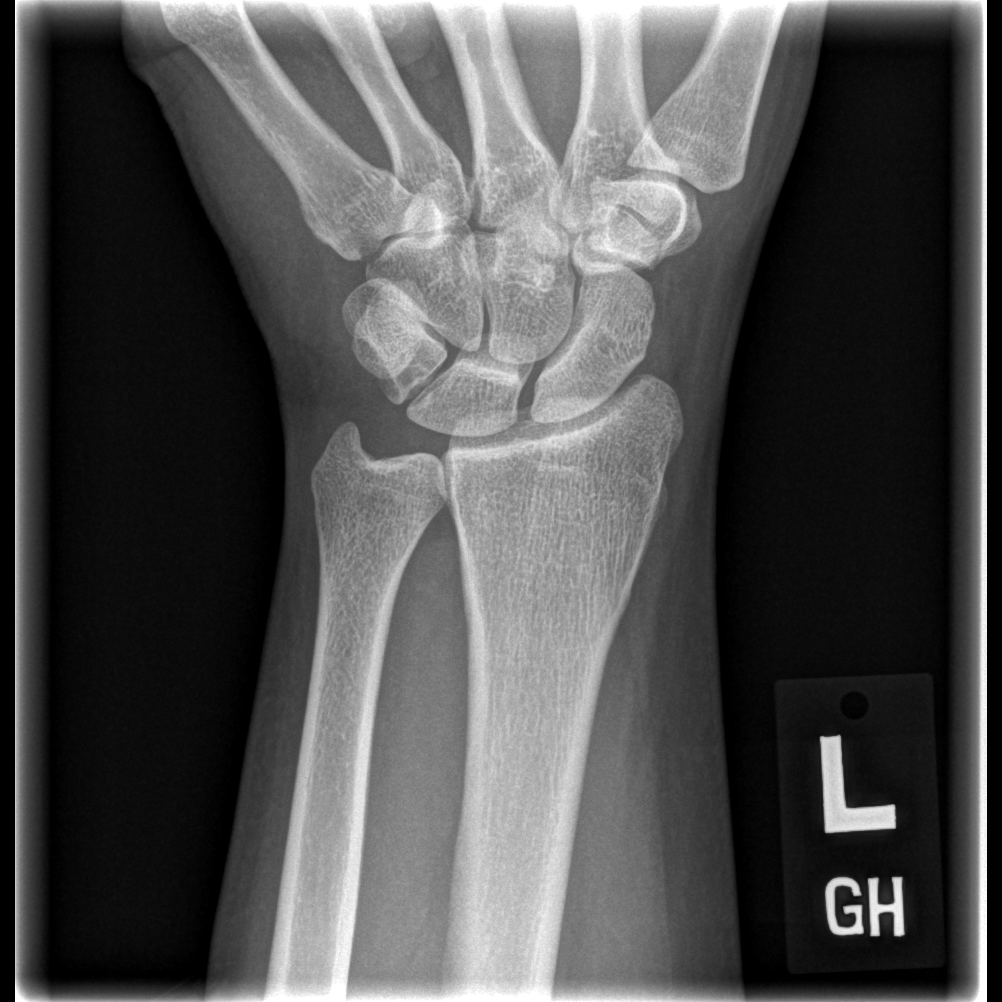

[x wrist obl left]
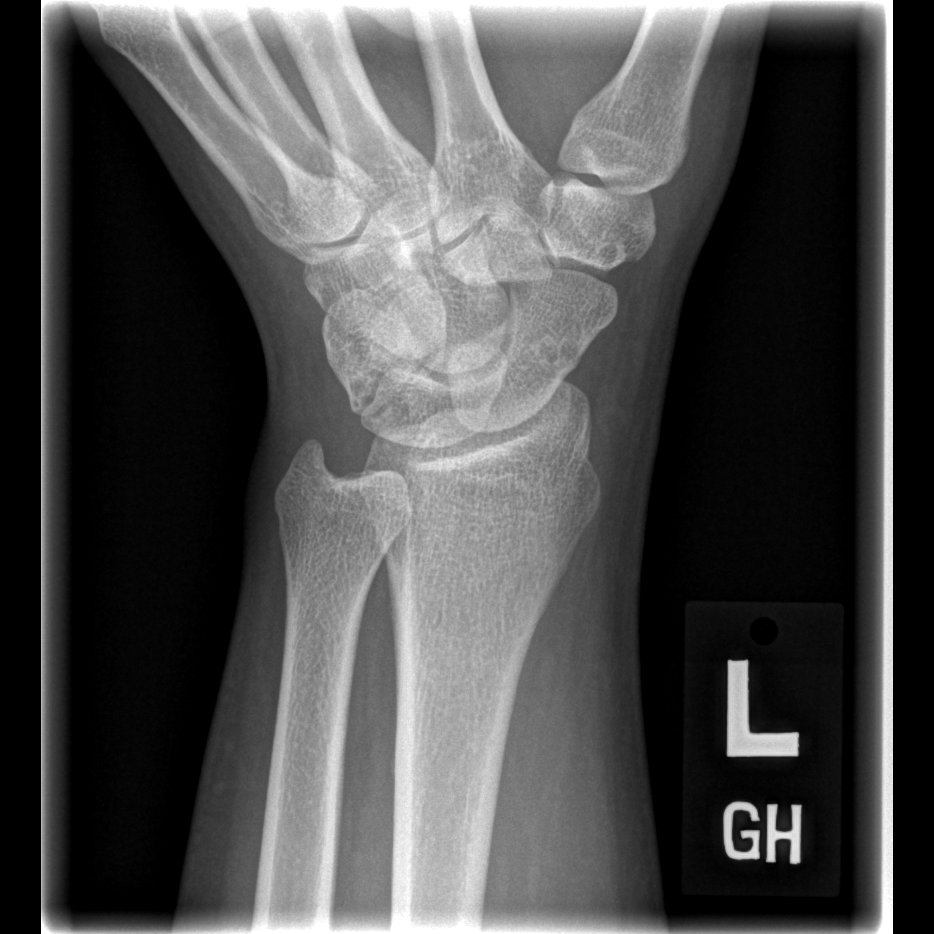

[x wrist lat left]
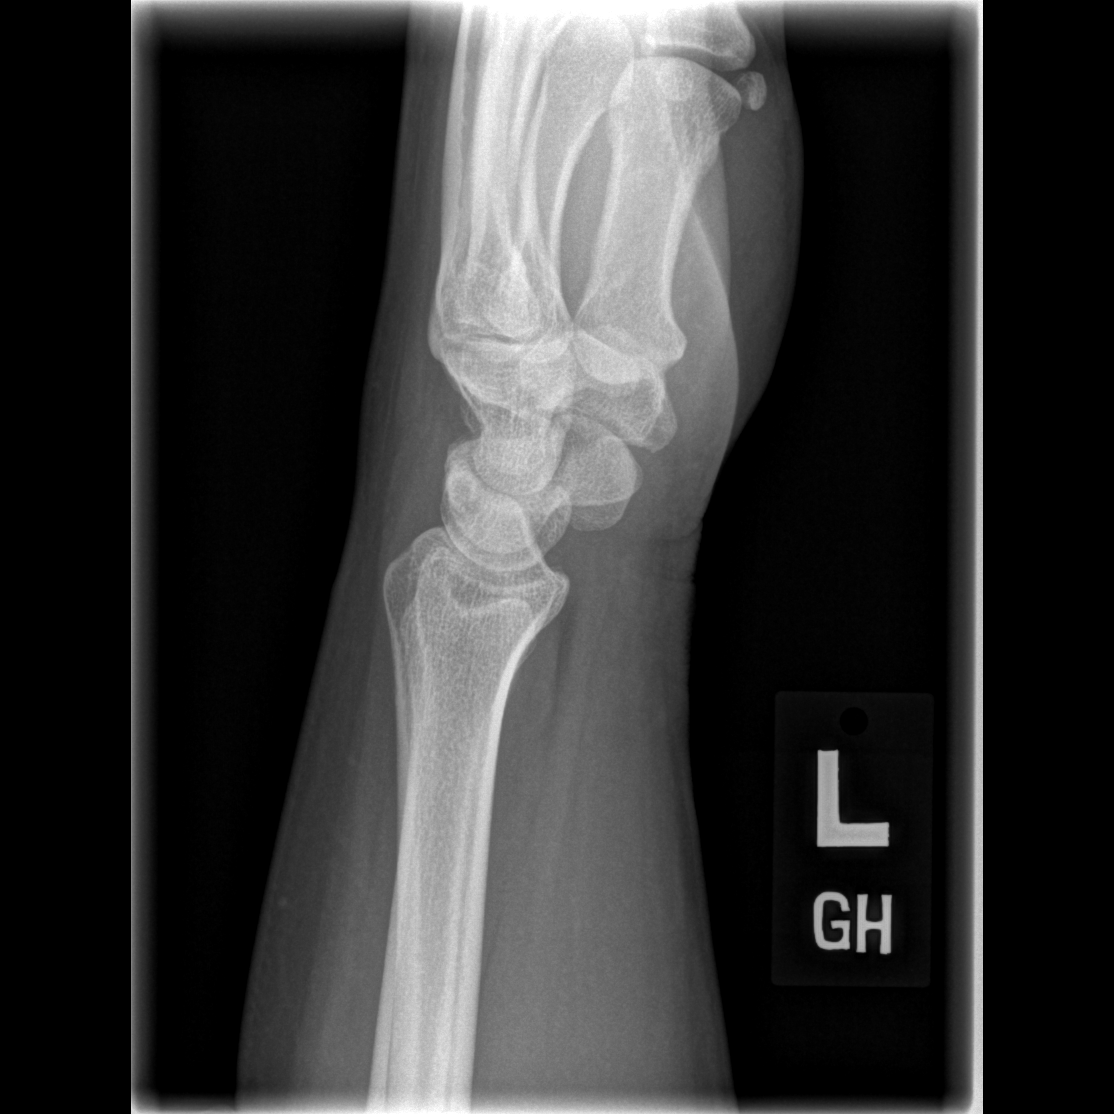

[x navicular]
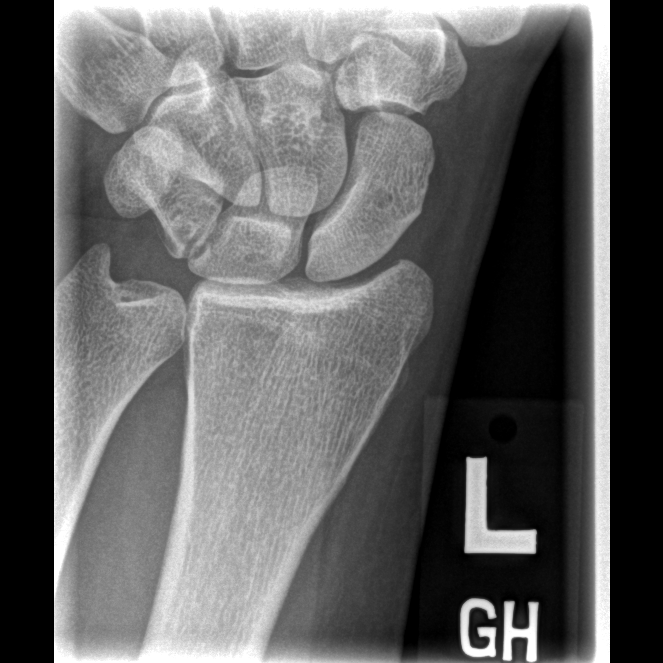

[4 of 4 positions shown; findings below may reference images not displayed]

FINDINGS: There is no evidence of fracture or dislocation. There is no
evidence of arthropathy or other focal bone abnormality. Soft
tissues are unremarkable.
IMPRESSION: Negative.

## 2022-05-17 DIAGNOSIS — J3089 Other allergic rhinitis: Secondary | ICD-10-CM | POA: Diagnosis not present

## 2022-05-17 DIAGNOSIS — J301 Allergic rhinitis due to pollen: Secondary | ICD-10-CM | POA: Diagnosis not present

## 2022-05-17 DIAGNOSIS — J3081 Allergic rhinitis due to animal (cat) (dog) hair and dander: Secondary | ICD-10-CM | POA: Diagnosis not present

## 2022-05-22 DIAGNOSIS — J301 Allergic rhinitis due to pollen: Secondary | ICD-10-CM | POA: Diagnosis not present

## 2022-05-22 DIAGNOSIS — J3089 Other allergic rhinitis: Secondary | ICD-10-CM | POA: Diagnosis not present

## 2022-05-22 DIAGNOSIS — J3081 Allergic rhinitis due to animal (cat) (dog) hair and dander: Secondary | ICD-10-CM | POA: Diagnosis not present

## 2022-06-03 DIAGNOSIS — J3089 Other allergic rhinitis: Secondary | ICD-10-CM | POA: Diagnosis not present

## 2022-06-03 DIAGNOSIS — J3081 Allergic rhinitis due to animal (cat) (dog) hair and dander: Secondary | ICD-10-CM | POA: Diagnosis not present

## 2022-06-03 DIAGNOSIS — J301 Allergic rhinitis due to pollen: Secondary | ICD-10-CM | POA: Diagnosis not present

## 2022-06-10 DIAGNOSIS — J301 Allergic rhinitis due to pollen: Secondary | ICD-10-CM | POA: Diagnosis not present

## 2022-06-11 DIAGNOSIS — J3089 Other allergic rhinitis: Secondary | ICD-10-CM | POA: Diagnosis not present

## 2022-06-11 DIAGNOSIS — J3081 Allergic rhinitis due to animal (cat) (dog) hair and dander: Secondary | ICD-10-CM | POA: Diagnosis not present

## 2022-06-17 DIAGNOSIS — J3089 Other allergic rhinitis: Secondary | ICD-10-CM | POA: Diagnosis not present

## 2022-06-17 DIAGNOSIS — J301 Allergic rhinitis due to pollen: Secondary | ICD-10-CM | POA: Diagnosis not present

## 2022-06-17 DIAGNOSIS — J3081 Allergic rhinitis due to animal (cat) (dog) hair and dander: Secondary | ICD-10-CM | POA: Diagnosis not present

## 2022-06-26 DIAGNOSIS — J3081 Allergic rhinitis due to animal (cat) (dog) hair and dander: Secondary | ICD-10-CM | POA: Diagnosis not present

## 2022-06-26 DIAGNOSIS — J301 Allergic rhinitis due to pollen: Secondary | ICD-10-CM | POA: Diagnosis not present

## 2022-06-26 DIAGNOSIS — J3089 Other allergic rhinitis: Secondary | ICD-10-CM | POA: Diagnosis not present

## 2022-07-11 DIAGNOSIS — J301 Allergic rhinitis due to pollen: Secondary | ICD-10-CM | POA: Diagnosis not present

## 2022-07-11 DIAGNOSIS — J3089 Other allergic rhinitis: Secondary | ICD-10-CM | POA: Diagnosis not present

## 2022-07-11 DIAGNOSIS — J3081 Allergic rhinitis due to animal (cat) (dog) hair and dander: Secondary | ICD-10-CM | POA: Diagnosis not present

## 2022-08-07 DIAGNOSIS — J301 Allergic rhinitis due to pollen: Secondary | ICD-10-CM | POA: Diagnosis not present

## 2022-08-07 DIAGNOSIS — J3089 Other allergic rhinitis: Secondary | ICD-10-CM | POA: Diagnosis not present

## 2022-08-07 DIAGNOSIS — J3081 Allergic rhinitis due to animal (cat) (dog) hair and dander: Secondary | ICD-10-CM | POA: Diagnosis not present

## 2022-08-14 DIAGNOSIS — J301 Allergic rhinitis due to pollen: Secondary | ICD-10-CM | POA: Diagnosis not present

## 2022-08-14 DIAGNOSIS — J3089 Other allergic rhinitis: Secondary | ICD-10-CM | POA: Diagnosis not present

## 2022-08-14 DIAGNOSIS — J3081 Allergic rhinitis due to animal (cat) (dog) hair and dander: Secondary | ICD-10-CM | POA: Diagnosis not present

## 2022-08-22 ENCOUNTER — Other Ambulatory Visit (HOSPITAL_COMMUNITY): Payer: Self-pay

## 2022-08-22 MED ORDER — TRANEXAMIC ACID 650 MG PO TABS
1300.0000 mg | ORAL_TABLET | Freq: Three times a day (TID) | ORAL | 11 refills | Status: DC
  Filled 2022-08-22: qty 30, 5d supply, fill #0

## 2022-08-23 DIAGNOSIS — J3089 Other allergic rhinitis: Secondary | ICD-10-CM | POA: Diagnosis not present

## 2022-08-23 DIAGNOSIS — J3081 Allergic rhinitis due to animal (cat) (dog) hair and dander: Secondary | ICD-10-CM | POA: Diagnosis not present

## 2022-08-23 DIAGNOSIS — J301 Allergic rhinitis due to pollen: Secondary | ICD-10-CM | POA: Diagnosis not present

## 2022-09-02 DIAGNOSIS — J3089 Other allergic rhinitis: Secondary | ICD-10-CM | POA: Diagnosis not present

## 2022-09-02 DIAGNOSIS — J3081 Allergic rhinitis due to animal (cat) (dog) hair and dander: Secondary | ICD-10-CM | POA: Diagnosis not present

## 2022-09-02 DIAGNOSIS — J301 Allergic rhinitis due to pollen: Secondary | ICD-10-CM | POA: Diagnosis not present

## 2022-09-10 ENCOUNTER — Other Ambulatory Visit (HOSPITAL_COMMUNITY): Payer: Self-pay

## 2022-09-19 DIAGNOSIS — J3089 Other allergic rhinitis: Secondary | ICD-10-CM | POA: Diagnosis not present

## 2022-09-19 DIAGNOSIS — J3081 Allergic rhinitis due to animal (cat) (dog) hair and dander: Secondary | ICD-10-CM | POA: Diagnosis not present

## 2022-09-19 DIAGNOSIS — J301 Allergic rhinitis due to pollen: Secondary | ICD-10-CM | POA: Diagnosis not present

## 2022-10-02 ENCOUNTER — Telehealth: Payer: 59 | Admitting: Physician Assistant

## 2022-10-02 ENCOUNTER — Other Ambulatory Visit (HOSPITAL_COMMUNITY): Payer: Self-pay

## 2022-10-02 DIAGNOSIS — R3989 Other symptoms and signs involving the genitourinary system: Secondary | ICD-10-CM

## 2022-10-02 MED ORDER — NITROFURANTOIN MONOHYD MACRO 100 MG PO CAPS
100.0000 mg | ORAL_CAPSULE | Freq: Two times a day (BID) | ORAL | 0 refills | Status: DC
  Filled 2022-10-02: qty 10, 5d supply, fill #0

## 2022-10-02 NOTE — Progress Notes (Signed)

## 2022-10-02 NOTE — Progress Notes (Signed)
I have spent 5 minutes in review of e-visit questionnaire, review and updating patient chart, medical decision making and response to patient.   Ladamien Rammel Cody Jaelin Devincentis, PA-C    

## 2022-10-09 DIAGNOSIS — J3081 Allergic rhinitis due to animal (cat) (dog) hair and dander: Secondary | ICD-10-CM | POA: Diagnosis not present

## 2022-10-09 DIAGNOSIS — J301 Allergic rhinitis due to pollen: Secondary | ICD-10-CM | POA: Diagnosis not present

## 2022-10-09 DIAGNOSIS — J3089 Other allergic rhinitis: Secondary | ICD-10-CM | POA: Diagnosis not present

## 2022-11-06 ENCOUNTER — Other Ambulatory Visit (HOSPITAL_COMMUNITY): Payer: Self-pay

## 2022-11-06 ENCOUNTER — Telehealth: Payer: 59 | Admitting: Physician Assistant

## 2022-11-06 DIAGNOSIS — U071 COVID-19: Secondary | ICD-10-CM

## 2022-11-06 MED ORDER — BENZONATATE 100 MG PO CAPS
100.0000 mg | ORAL_CAPSULE | Freq: Three times a day (TID) | ORAL | 0 refills | Status: DC | PRN
  Filled 2022-11-06: qty 30, 10d supply, fill #0

## 2022-11-06 NOTE — Progress Notes (Signed)
I have spent 5 minutes in review of e-visit questionnaire, review and updating patient chart, medical decision making and response to patient.   Shakevia Sarris Cody Glenora Morocho, PA-C    

## 2022-11-06 NOTE — Progress Notes (Signed)

## 2022-11-18 DIAGNOSIS — N390 Urinary tract infection, site not specified: Secondary | ICD-10-CM | POA: Diagnosis not present

## 2022-11-18 DIAGNOSIS — N898 Other specified noninflammatory disorders of vagina: Secondary | ICD-10-CM | POA: Diagnosis not present

## 2022-12-02 DIAGNOSIS — J301 Allergic rhinitis due to pollen: Secondary | ICD-10-CM | POA: Diagnosis not present

## 2022-12-02 DIAGNOSIS — R052 Subacute cough: Secondary | ICD-10-CM | POA: Diagnosis not present

## 2022-12-02 DIAGNOSIS — J3081 Allergic rhinitis due to animal (cat) (dog) hair and dander: Secondary | ICD-10-CM | POA: Diagnosis not present

## 2022-12-02 DIAGNOSIS — H1045 Other chronic allergic conjunctivitis: Secondary | ICD-10-CM | POA: Diagnosis not present

## 2022-12-02 DIAGNOSIS — J3089 Other allergic rhinitis: Secondary | ICD-10-CM | POA: Diagnosis not present

## 2022-12-04 DIAGNOSIS — J3081 Allergic rhinitis due to animal (cat) (dog) hair and dander: Secondary | ICD-10-CM | POA: Diagnosis not present

## 2022-12-04 DIAGNOSIS — J301 Allergic rhinitis due to pollen: Secondary | ICD-10-CM | POA: Diagnosis not present

## 2022-12-04 DIAGNOSIS — J3089 Other allergic rhinitis: Secondary | ICD-10-CM | POA: Diagnosis not present

## 2022-12-06 DIAGNOSIS — J3089 Other allergic rhinitis: Secondary | ICD-10-CM | POA: Diagnosis not present

## 2022-12-06 DIAGNOSIS — J3081 Allergic rhinitis due to animal (cat) (dog) hair and dander: Secondary | ICD-10-CM | POA: Diagnosis not present

## 2022-12-06 DIAGNOSIS — J301 Allergic rhinitis due to pollen: Secondary | ICD-10-CM | POA: Diagnosis not present

## 2022-12-11 DIAGNOSIS — J301 Allergic rhinitis due to pollen: Secondary | ICD-10-CM | POA: Diagnosis not present

## 2022-12-11 DIAGNOSIS — J3089 Other allergic rhinitis: Secondary | ICD-10-CM | POA: Diagnosis not present

## 2022-12-11 DIAGNOSIS — J3081 Allergic rhinitis due to animal (cat) (dog) hair and dander: Secondary | ICD-10-CM | POA: Diagnosis not present

## 2022-12-19 DIAGNOSIS — Z1231 Encounter for screening mammogram for malignant neoplasm of breast: Secondary | ICD-10-CM | POA: Diagnosis not present

## 2022-12-19 DIAGNOSIS — D251 Intramural leiomyoma of uterus: Secondary | ICD-10-CM | POA: Diagnosis not present

## 2022-12-19 DIAGNOSIS — Z01419 Encounter for gynecological examination (general) (routine) without abnormal findings: Secondary | ICD-10-CM | POA: Diagnosis not present

## 2023-01-08 DIAGNOSIS — J3081 Allergic rhinitis due to animal (cat) (dog) hair and dander: Secondary | ICD-10-CM | POA: Diagnosis not present

## 2023-01-08 DIAGNOSIS — J3089 Other allergic rhinitis: Secondary | ICD-10-CM | POA: Diagnosis not present

## 2023-01-08 DIAGNOSIS — J301 Allergic rhinitis due to pollen: Secondary | ICD-10-CM | POA: Diagnosis not present

## 2023-01-09 ENCOUNTER — Ambulatory Visit
Admission: EM | Admit: 2023-01-09 | Discharge: 2023-01-09 | Disposition: A | Discharge status: Home / Self Care | Payer: Commercial Managed Care - PPO

## 2023-01-09 ENCOUNTER — Emergency Department (HOSPITAL_BASED_OUTPATIENT_CLINIC_OR_DEPARTMENT_OTHER)
Admission: EM | Admit: 2023-01-09 | Discharge: 2023-01-09 | Disposition: A | Discharge status: Home / Self Care | Payer: Commercial Managed Care - PPO | Attending: Emergency Medicine | Admitting: Emergency Medicine

## 2023-01-09 ENCOUNTER — Other Ambulatory Visit: Payer: Self-pay

## 2023-01-09 DIAGNOSIS — R252 Cramp and spasm: Secondary | ICD-10-CM | POA: Diagnosis not present

## 2023-01-09 DIAGNOSIS — R030 Elevated blood-pressure reading, without diagnosis of hypertension: Secondary | ICD-10-CM | POA: Diagnosis not present

## 2023-01-09 DIAGNOSIS — M79604 Pain in right leg: Secondary | ICD-10-CM | POA: Diagnosis not present

## 2023-01-09 DIAGNOSIS — M7918 Myalgia, other site: Secondary | ICD-10-CM

## 2023-01-09 DIAGNOSIS — R519 Headache, unspecified: Secondary | ICD-10-CM | POA: Diagnosis not present

## 2023-01-09 DIAGNOSIS — M79669 Pain in unspecified lower leg: Secondary | ICD-10-CM | POA: Diagnosis not present

## 2023-01-09 DIAGNOSIS — M791 Myalgia, unspecified site: Secondary | ICD-10-CM | POA: Diagnosis not present

## 2023-01-09 DIAGNOSIS — I10 Essential (primary) hypertension: Secondary | ICD-10-CM | POA: Diagnosis present

## 2023-01-09 DIAGNOSIS — M79605 Pain in left leg: Secondary | ICD-10-CM | POA: Diagnosis not present

## 2023-01-09 LAB — COMPREHENSIVE METABOLIC PANEL WITH GFR
ALT: 17 U/L (ref 0–44)
AST: 15 U/L (ref 15–41)
Albumin: 4 g/dL (ref 3.5–5.0)
Alkaline Phosphatase: 55 U/L (ref 38–126)
Anion gap: 8 (ref 5–15)
BUN: 16 mg/dL (ref 6–20)
CO2: 26 mmol/L (ref 22–32)
Calcium: 8.9 mg/dL (ref 8.9–10.3)
Chloride: 105 mmol/L (ref 98–111)
Creatinine, Ser: 0.9 mg/dL (ref 0.44–1.00)
GFR, Estimated: >60 mL/min (ref >60)
Glucose, Bld: 103 mg/dL — ABNORMAL HIGH (ref 70–99)
Potassium: 4 mmol/L (ref 3.5–5.1)
Sodium: 139 mmol/L (ref 135–145)
Total Bilirubin: 0.3 mg/dL (ref 0.3–1.2)
Total Protein: 7.2 g/dL (ref 6.5–8.1)

## 2023-01-09 LAB — CBC WITH DIFFERENTIAL/PLATELET
Abs Immature Granulocytes: 0.03 10*3/uL (ref 0.00–0.07)
Basophils Absolute: 0.1 10*3/uL (ref 0.0–0.1)
Basophils Relative: 1 %
Eosinophils Absolute: 0.2 10*3/uL (ref 0.0–0.5)
Eosinophils Relative: 2 %
HCT: 34.2 % — ABNORMAL LOW (ref 36.0–46.0)
Hemoglobin: 10.8 g/dL — ABNORMAL LOW (ref 12.0–15.0)
Immature Granulocytes: 0 %
Lymphocytes Relative: 34 %
Lymphs Abs: 2.8 10*3/uL (ref 0.7–4.0)
MCH: 26.2 pg (ref 26.0–34.0)
MCHC: 31.6 g/dL (ref 30.0–36.0)
MCV: 82.8 fL (ref 80.0–100.0)
Monocytes Absolute: 0.6 10*3/uL (ref 0.1–1.0)
Monocytes Relative: 7 %
Neutro Abs: 4.5 10*3/uL (ref 1.7–7.7)
Neutrophils Relative %: 56 %
Platelets: 486 10*3/uL — ABNORMAL HIGH (ref 150–400)
RBC: 4.13 MIL/uL (ref 3.87–5.11)
RDW: 14.6 % (ref 11.5–15.5)
WBC: 8.1 10*3/uL (ref 4.0–10.5)
nRBC: 0 % (ref 0.0–0.2)

## 2023-01-09 LAB — BRAIN NATRIURETIC PEPTIDE: B Natriuretic Peptide: 13 pg/mL (ref 0.0–100.0)

## 2023-01-09 LAB — CK: Total CK: 104 U/L (ref 38–234)

## 2023-01-09 LAB — HCG, SERUM, QUALITATIVE: Preg, Serum: NEGATIVE

## 2023-01-09 MED ORDER — METOCLOPRAMIDE HCL 5 MG/ML IJ SOLN
10.0000 mg | Freq: Once | INTRAMUSCULAR | Status: AC
  Administered 2023-01-09: 10 mg via INTRAVENOUS
  Filled 2023-01-09: qty 2

## 2023-01-09 MED ORDER — DIPHENHYDRAMINE HCL 50 MG/ML IJ SOLN
25.0000 mg | Freq: Once | INTRAMUSCULAR | Status: AC
  Administered 2023-01-09: 25 mg via INTRAVENOUS
  Filled 2023-01-09: qty 1

## 2023-01-09 NOTE — ED Notes (Signed)
Reviewed AVS/discharge instruction with patient. Time allotted for and all questions answered. Patient is agreeable for d/c and escorted to ed exit by staff.  

## 2023-01-09 NOTE — ED Triage Notes (Signed)
Patient with c/o high blood pressure, headaches and leg cramps. Patient states BP at home was [id number] systolic. States leg cramps have been waking her up at night.

## 2023-01-09 NOTE — Discharge Instructions (Signed)
Please go to the emergency department as soon as you leave urgent care for further evaluation and management. ?

## 2023-01-09 NOTE — ED Triage Notes (Signed)
POV from home, amb to triage, A&O x 4, GCS 15  Elevated BP at OBGYN in [place].   Got allergy shot yesterday started to have headaches and had BP readings systolic [id number]. Leg cramps began 3 wks ago. No other medical problems per pt.

## 2023-01-09 NOTE — ED Provider Notes (Signed)
[facility]    CSN: [id number] Arrival date & time: 01/09/23  1723      History   Chief Complaint Chief Complaint  Patient presents with   Hypertension    My blood pressure has been running high for the past two to three days. I have a really bad headache and suffering from severe leg cramps at nighttime. - Entered by patient    HPI [name] is a 42 y.o. female.   Patient presents today with concerns of high blood pressure.  Patient reports that she went to her gynecologist about a month ago and her blood pressure was similar to what it is today at [id number] systolic.  Patient says she went to her allergy specialist today to receive her allergy shot.  Immediately after they gave her her shot, she developed a severe headache.  They checked her blood pressure and it was [id number] systolic.  They gave her some Zyrtec and water to ensure no allergic reaction was occurring.  Reports that she has never had a reaction to her allergy shots before and has been taking it for multiple years.  They rechecked her blood pressure before discharge and it was 0000000 systolic.  Patient states that headache has not improved and is currently rated 7/10 on pain scale.  Reports that headache is present mainly at the temples but does radiate across the forehead.  She reports history of migraines but states that this feels different than her normal migraines as the migraine headache is typically in the occipital portion of the head and into her neck.  She denies any recent falls or head trauma.  She reports some intermittent dizziness but denies blurred vision, nausea, vomiting, chest pain, shortness of breath.  Patient states that she had her blood pressure checked twice at work today and it was [id number] systolic.  She also reports that she has been having some bilateral leg cramping at night for about 3 weeks and is not sure if this is related.  Denies any history of hypertension or ever having to take medication for  it.   Hypertension    History reviewed. No pertinent past medical history.  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      Home Medications    Prior to Admission medications   Medication Sig Start Date End Date Taking? Authorizing Provider  azelastine (ASTELIN) 0.1 % nasal spray Place 2 sprays into both nostrils 2 (two) times daily. Use in each nostril as directed 03/14/22   Perlie Mayo, NP  estradiol (ESTRACE) 1 MG tablet Take 1 mg by mouth 2 (two) times daily.  12/29/14  [provider]  progesterone (PROMETRIUM) 200 MG capsule Take 400 mg by mouth at bedtime.  12/29/14  [provider]    Family History Family History  Problem Relation Age of Onset   Hypertension Mother    Hypertension Father     Social History Social History   Tobacco Use   Smoking status: Never   Smokeless tobacco:  Never  Vaping Use   Vaping Use: Never used  Substance Use Topics   Alcohol use: Not Currently    Comment: denies   Drug use: No     Allergies   Amoxicillin, Codeine, Hydrocodone, Penicillins, and Tylenol with codeine #3 [acetaminophen-codeine]   Review of Systems Review of Systems Per HPI  Physical Exam Triage Vital Signs ED Triage Vitals  Enc Vitals Group     BP 01/09/23 1742 (!) 144/92     Pulse Rate 01/09/23 1742 79     Resp 01/09/23 1742 18     Temp 01/09/23 1742 98 F (36.7 C)     Temp Source 01/09/23 1742 Oral     SpO2 01/09/23 1742 97 %     Weight --      Height --      Head Circumference --      Peak Flow --      Pain Score  01/09/23 1743 7     Pain Loc --      Pain Edu? --      Excl. in [place]? --    No data found.  Updated Vital Signs BP (!) 144/92 (BP Location: Left Arm)   Pulse 79   Temp 98 F (36.7 C) (Oral)   Resp 18   LMP 12/28/2022 (Exact Date)   SpO2 97%   Visual Acuity Right Eye Distance:   Left Eye Distance:   Bilateral Distance:    Right Eye Near:   Left Eye Near:    Bilateral Near:     Physical Exam Constitutional:      General: She is not in acute distress.    Appearance: Normal appearance. She is not toxic-appearing or diaphoretic.  HENT:     Head: Normocephalic and atraumatic.  Eyes:     Extraocular Movements: Extraocular movements intact.     Conjunctiva/sclera: Conjunctivae normal.     Pupils: Pupils are equal, round, and reactive to light.  Cardiovascular:     Rate and Rhythm: Normal rate and regular rhythm.     Pulses: Normal pulses.     Heart sounds: Normal heart sounds.  Pulmonary:     Effort: Pulmonary effort is normal. No respiratory distress.     Breath sounds: Normal breath sounds.  Musculoskeletal:     Comments: Patient has tenderness to palpation to bilateral calves of legs.  No obvious swelling noted.  Neurovascular intact.  Neurological:     General: No focal deficit present.     Mental Status: She is alert and oriented to person, place, and time. Mental status is at baseline.     Cranial Nerves: Cranial nerves 2-12 are intact.     Sensory: Sensation is intact.     Motor: Motor function is intact.     Coordination: Coordination is intact.     Gait: Gait is intact.  Psychiatric:        Mood and Affect: Mood normal.        Behavior: Behavior normal.        Thought Content: Thought content normal.        Judgment: Judgment normal.      UC Treatments / Results  Labs (all labs ordered are listed, but only abnormal results are displayed) Labs Reviewed - No data to display  EKG   Radiology No results found.  Procedures Procedures (including  critical care time)  Medications Ordered in UC Medications - No data to display  Initial Impression / Assessment and Plan /  UC Course  I have reviewed the triage vital signs and the nursing notes.  Pertinent labs & imaging results that were available during my care of the patient were reviewed by me and considered in my medical decision making (see chart for details).     Given patient is having significant headache and leg cramps with associated intermittent elevated blood pressure reading, I do think more extensive evaluation is necessary to ensure no complications.  Patient was advised to go to the ER for further evaluation and management and was agreeable with plan.  Vital signs stable at discharge. Agree with patient's husband transporting her to the ER. Final Clinical Impressions(s) / UC Diagnoses   Final diagnoses:  Severe headache  Elevated blood pressure reading  Leg cramps     Discharge Instructions      Please go to the emergency department as soon as you leave urgent care for further evaluation and management.    ED Prescriptions   None    PDMP not reviewed this encounter.   Teodora Medici, [place] 01/09/23 [phone]

## 2023-01-09 NOTE — Discharge Instructions (Addendum)
You have been evaluated for your symptoms.  Fortunately your blood work today did not show any concerning finding.  Your blood pressure did normalize after headache medication.  Please follow-up closely with your doctor for further care.  Return if you have any concern.

## 2023-01-09 NOTE — ED Notes (Signed)
Patient is being discharged from the Urgent Care and sent to the Emergency Department via POV . Per Hildred Alamin, NP, patient is in need of higher level of care due to hypertension and headaches. Patient is aware and verbalizes understanding of plan of care.  Vitals:   01/09/23 1742  BP: (!) 144/92  Pulse: 79  Resp: 18  Temp: 98 F (36.7 C)  SpO2: 97%

## 2023-01-09 NOTE — ED Provider Notes (Signed)
[facility] Provider Note   CSN: [id number] Arrival date & time: 01/09/23  1858     History  Chief Complaint  Patient presents with   Hypertension    [name] is a 42 y.o. female.  The history is provided by the patient, medical records and the spouse. No language interpreter was used.  Hypertension     42 year old female without significant past medical history who presents to the ED from urgent care center with concerns of elevated blood pressure.  Patient developed gradual onset of frontal throbbing headache since yesterday.  She feels like she is not her normal self, and she also noticed bilateral leg cramping ongoing for the past 2 weeks.  She tries taking Tylenol at home but her symptoms still persist.  States her headache felt different from her usual migraine.  She does not endorse any fever or chills no runny nose sneezing or coughing no chest pain or shortness of breath no neck stiffness no abdominal pain no nausea vomiting or diarrhea no rash no focal numbness or focal weakness.  She went to urgent care center today for her complaint and was told to come to the ER for further evaluation.  She also mention she normally gets an allergy shot on a weekly basis and has been doing so for the past 2 years.  She reported headache started after she had her allergy shot yesterday.  She was giving Zyrtec but headache still persist.  She also recalled that her blood pressure was slightly elevated during her last OB/GYN visit in January.  No medication was started at that time.  She denies any other environmental changes.  Headache is currently moderate in severity.  She reports her leg cramps sometimes keep her up at night.  She also noticed some swelling to both legs.  She denies any prior history of PE or DVT no recent surgery prolonged bedrest active cancer hemoptysis chest pain shortness of breath or taking oral hormone.  She is not on any  statin medication.  Home Medications Prior to Admission medications   Medication Sig Start Date End Date Taking? Authorizing Provider  azelastine (ASTELIN) 0.1 % nasal spray Place 2 sprays into both nostrils 2 (two) times daily. Use in each nostril as directed 03/14/22   Perlie Mayo, NP  estradiol (ESTRACE) 1 MG tablet Take 1 mg by mouth 2 (two) times daily.  12/29/14  [provider]  progesterone (PROMETRIUM) 200 MG capsule Take 400 mg by mouth at bedtime.  12/29/14  [provider]      Allergies    Amoxicillin, Codeine, Hydrocodone, Penicillins, and Tylenol with codeine #3 [acetaminophen-codeine]    Review of Systems   Review of Systems  All other systems reviewed and are negative.   Physical Exam Updated Vital Signs BP (!) 154/117   Pulse 87   Temp 98.6 F (37 C)   Resp 20   Ht '5\' 4"'$  (1.626 m)   Wt 101.6 kg   LMP 12/28/2022 (Exact Date)   SpO2 98%   BMI 38.45 kg/m  Physical Exam Vitals and nursing note reviewed.  Constitutional:      General: She is not in acute distress.    Appearance: She is well-developed.  HENT:     Head: Atraumatic.  Eyes:     Conjunctiva/sclera: Conjunctivae normal.  Neck:     Comments: No nuchal rigidity Cardiovascular:     Rate and Rhythm: Normal rate and regular rhythm.  Pulses: Normal pulses.     Heart sounds: Normal heart sounds.  Pulmonary:     Effort: Pulmonary effort is normal.  Abdominal:     Palpations: Abdomen is soft.     Tenderness: There is no abdominal tenderness.  Musculoskeletal:     Cervical back: Normal range of motion and neck supple.  Skin:    Findings: No rash.  Neurological:     Mental Status: She is alert and oriented to person, place, and time. Mental status is at baseline.     GCS: GCS eye subscore is 4. GCS verbal subscore is 5. GCS motor subscore is 6.     Cranial Nerves: Cranial nerves 2-12 are intact.     Sensory: Sensation is intact.     Motor: Motor function is intact.      Coordination: Coordination is intact.  Psychiatric:        Mood and Affect: Mood normal.     ED Results / Procedures / Treatments   Labs (all labs ordered are listed, but only abnormal results are displayed) Labs Reviewed  COMPREHENSIVE METABOLIC PANEL - Abnormal; Notable for the following components:      Result Value   Glucose, Bld 103 (*)    All other components within normal limits  CBC WITH DIFFERENTIAL/PLATELET - Abnormal; Notable for the following components:   Hemoglobin 10.8 (*)    HCT 34.2 (*)    Platelets 486 (*)    All other components within normal limits  CK  BRAIN NATRIURETIC PEPTIDE  HCG, SERUM, QUALITATIVE    EKG None  Radiology No results found.  Procedures Procedures    Medications Ordered in ED Medications  diphenhydrAMINE (BENADRYL) injection 25 mg (25 mg Intravenous Given 01/09/23 2057)  metoCLOPramide (REGLAN) injection 10 mg (10 mg Intravenous Given 01/09/23 2057)    ED Course/ Medical Decision Making/ A&P                             Medical Decision Making Amount and/or Complexity of Data Reviewed Labs: ordered.  Risk Prescription drug management.   BP 109/73   Pulse 98   Temp 98.6 F (37 C)   Resp 18   Ht '5\' 4"'$  (1.626 m)   Wt 101.6 kg   LMP 12/28/2022 (Exact Date)   SpO2 97%   BMI 38.45 kg/m   56:33 PM  42 year old female without significant past medical history who presents to the ED from urgent care center with concerns of elevated blood pressure.  Patient developed gradual onset of frontal throbbing headache since yesterday.  She feels like she is not her normal self, and she also noticed bilateral leg cramping ongoing for the past 2 weeks.  She tries taking Tylenol at home but her symptoms still persist.  States her headache felt different from her usual migraine.  She does not endorse any fever or chills no runny nose sneezing or coughing no chest pain or shortness of breath no neck stiffness no abdominal pain no nausea  vomiting or diarrhea no rash no focal numbness or focal weakness.  She went to urgent care center today for her complaint and was told to come to the ER for further evaluation.  She also mention she normally gets an allergy shot on a weekly basis and has been doing so for the past 2 years.  She reported headache started after she had her allergy shot yesterday.  She was giving Zyrtec but headache  still persist.  She also recalled that her blood pressure was slightly elevated during her last OB/GYN visit in January.  No medication was started at that time.  She denies any other environmental changes.  Headache is currently moderate in severity.  She reports her leg cramps sometimes keep her up at night.  She also noticed some swelling to both legs.  She denies any prior history of PE or DVT no recent surgery prolonged bedrest active cancer hemoptysis chest pain shortness of breath or taking oral hormone.  She is not on any statin medication.  On exam this is a well-appearing female appears to be in no acute discomfort.  She is mentating appropriately she has no focal neurodeficit.  She does not have any nuchal rigidity concerning for meningitis.  Heart with normal rate and rhythm, lungs clear to auscultation bilaterally abdomen soft nontender, chest equal strength throughout, she is answering questions appropriately.  Normal vision.  Vital signs reviewed and remarkable for mildly elevated blood pressure of 154/117.  She is afebrile, no hypoxia.  -Labs ordered, independently viewed and interpreted by me.  Labs remarkable for normal BNP, preg test negative, normal CK, electrolytes are reassuring -The patient was maintained on a cardiac monitor.  I personally viewed and interpreted the cardiac monitored which showed an underlying rhythm of: NSR -Imaging including CXR considered but pt without chest pain or sob or cough therefore xray not warranted -This patient presents to the ED for concern of HTN, this  involves an extensive number of treatment options, and is a complaint that carries with it a high risk of complications and morbidity.  The differential diagnosis includes stress, primary hypertension, headache, hypertensive urgency, hypertensive emergency -Co morbidities that complicate the patient evaluation includes none -Treatment includes migraine cocktail -Reevaluation of the patient after these medicines showed that the patient improved -PCP office notes or outside notes reviewed -Escalation to admission/observation considered: patients feels much better, is comfortable with discharge, and will follow up with PCP -Prescription medication considered, patient comfortable with OTC medication -Social Determinant of Health considered   11:07 PM  Labs obtained and overall reassuring.  No concerning finding.  No evidence concerning for rhabdomyolysis, congestive heart failure causing her leg pain and swelling.  I have very low suspicion for bilateral DVT accounting for her legs tenderness.  After receiving migraine cocktail patient resting comfortably and headache did improve.  Blood pressure normalized.  At this time recommend patient to follow-up outpatient with doctor for further care.  Return precaution given.  Doubt hypertensive emergency.        Final Clinical Impression(s) / ED Diagnoses Final diagnoses:  Bad headache  Myalgia, lower leg    Rx / DC Orders ED Discharge Orders     None         Domenic Moras, PA-C 01/09/23 [place], [place], MD 01/09/23 [phone]

## 2023-01-22 DIAGNOSIS — J301 Allergic rhinitis due to pollen: Secondary | ICD-10-CM | POA: Diagnosis not present

## 2023-01-22 DIAGNOSIS — J3089 Other allergic rhinitis: Secondary | ICD-10-CM | POA: Diagnosis not present

## 2023-01-22 DIAGNOSIS — J3081 Allergic rhinitis due to animal (cat) (dog) hair and dander: Secondary | ICD-10-CM | POA: Diagnosis not present

## 2023-01-24 ENCOUNTER — Ambulatory Visit: Payer: Commercial Managed Care - PPO | Admitting: Family Medicine

## 2023-01-27 ENCOUNTER — Ambulatory Visit: Payer: Commercial Managed Care - PPO | Admitting: Family Medicine

## 2023-01-27 DIAGNOSIS — J3089 Other allergic rhinitis: Secondary | ICD-10-CM | POA: Diagnosis not present

## 2023-01-27 DIAGNOSIS — J301 Allergic rhinitis due to pollen: Secondary | ICD-10-CM | POA: Diagnosis not present

## 2023-01-27 DIAGNOSIS — J3081 Allergic rhinitis due to animal (cat) (dog) hair and dander: Secondary | ICD-10-CM | POA: Diagnosis not present

## 2023-02-06 ENCOUNTER — Ambulatory Visit: Payer: Commercial Managed Care - PPO | Admitting: Family Medicine

## 2023-02-11 DIAGNOSIS — H524 Presbyopia: Secondary | ICD-10-CM | POA: Diagnosis not present

## 2023-02-13 DIAGNOSIS — J3089 Other allergic rhinitis: Secondary | ICD-10-CM | POA: Diagnosis not present

## 2023-02-13 DIAGNOSIS — J3081 Allergic rhinitis due to animal (cat) (dog) hair and dander: Secondary | ICD-10-CM | POA: Diagnosis not present

## 2023-02-13 DIAGNOSIS — J301 Allergic rhinitis due to pollen: Secondary | ICD-10-CM | POA: Diagnosis not present

## 2023-02-24 ENCOUNTER — Encounter: Payer: Self-pay | Admitting: Family Medicine

## 2023-02-24 ENCOUNTER — Ambulatory Visit: Payer: Commercial Managed Care - PPO | Admitting: Family Medicine

## 2023-02-24 VITALS — BP 140/86 | HR 91 | Temp 97.9°F | Ht 64.0 in | Wt 235.6 lb

## 2023-02-24 DIAGNOSIS — Z13220 Encounter for screening for lipoid disorders: Secondary | ICD-10-CM

## 2023-02-24 DIAGNOSIS — J3089 Other allergic rhinitis: Secondary | ICD-10-CM | POA: Diagnosis not present

## 2023-02-24 DIAGNOSIS — Z131 Encounter for screening for diabetes mellitus: Secondary | ICD-10-CM | POA: Diagnosis not present

## 2023-02-24 DIAGNOSIS — R030 Elevated blood-pressure reading, without diagnosis of hypertension: Secondary | ICD-10-CM | POA: Diagnosis not present

## 2023-02-24 DIAGNOSIS — E538 Deficiency of other specified B group vitamins: Secondary | ICD-10-CM | POA: Diagnosis not present

## 2023-02-24 DIAGNOSIS — J3081 Allergic rhinitis due to animal (cat) (dog) hair and dander: Secondary | ICD-10-CM | POA: Diagnosis not present

## 2023-02-24 DIAGNOSIS — D649 Anemia, unspecified: Secondary | ICD-10-CM

## 2023-02-24 DIAGNOSIS — J301 Allergic rhinitis due to pollen: Secondary | ICD-10-CM | POA: Diagnosis not present

## 2023-02-24 DIAGNOSIS — Z0001 Encounter for general adult medical examination with abnormal findings: Secondary | ICD-10-CM | POA: Diagnosis not present

## 2023-02-24 DIAGNOSIS — Z0000 Encounter for general adult medical examination without abnormal findings: Secondary | ICD-10-CM | POA: Insufficient documentation

## 2023-02-24 NOTE — Addendum Note (Signed)
Addended by: Trudee Kuster on: 02/24/2023 04:54 PM   Modules accepted: Orders

## 2023-02-24 NOTE — Progress Notes (Addendum)
New Patient Office Visit  Subjective    Patient ID: [name], female    DOB: September 29, 1981  Age: 42 y.o. MRN: [id number]  CC:  Chief Complaint  Patient presents with   New Patient (Initial Visit)    HPI [name] presents to establish care. Encounter Diagnoses  Name Primary?   Healthcare maintenance Yes   Elevated BP without diagnosis of hypertension    Normocytic anemia    Folic acid deficiency    For establishment of care, physical exam and follow-up of above recent elevated blood pressure readings.  She is physically active by walking her dogs.  She does have regular dental care.  She works as [employer] officer.  She is married and lives at home with her husband and 41 year old daughter.  They have 2 dogs.  Both of her parents had hypertension.  She does experience headaches from time to time.  Recent emergency room evaluation with a normal CT of head.  Denies chest pain shortness of breath occultly breathing.  Both parents have hypertension.  [facility] care is through GYN.  Outpatient Encounter Medications as of 02/24/2023  Medication Sig   azelastine (ASTELIN) 0.1 % nasal spray Place 2 sprays into both nostrils 2 (two) times daily. Use in each nostril as directed   folic acid (FOLVITE) 1 MG tablet Take 1 tablet (1 mg total) by mouth daily.   OMEPRAZOLE PO Take by mouth.   Ondansetron HCl (ZOFRAN PO) Take by mouth.   [DISCONTINUED] estradiol (ESTRACE) 1 MG tablet Take 1 mg by mouth 2 (two) times daily.   [DISCONTINUED] progesterone (PROMETRIUM) 200 MG capsule Take 400 mg by mouth at bedtime.   No facility-administered encounter medications on file as of 02/24/2023.    History reviewed. No pertinent past medical history.  Past Surgical History:  Procedure Laterality Date   APPENDECTOMY  1984   OOPHORECTOMY Left 2007   OVARIAN CYST SURGERY  2013   SALPINGOOPHORECTOMY     TUBAL LIGATION  2005    Family History  Problem Relation Age of Onset   Hypertension  Mother    Hypertension Father     Social History   Socioeconomic History   Marital status: Married    Spouse name: Not on file   Number of children: Not on file   Years of education: Not on file   Highest education level: Not on file  Occupational History   Not on file  Tobacco Use   Smoking status: Never   Smokeless tobacco: Never  Vaping Use   Vaping Use: Never used  Substance and Sexual Activity   Alcohol use: Not Currently    Comment: denies   Drug use: No   Sexual activity: Yes    Partners: Male    Birth control/protection: Surgical  Other Topics Concern   Not on file  Social History Narrative   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   HENT: Negative.    Eyes:  Negative for blurred vision, discharge and redness.  Respiratory: Negative.    Cardiovascular: Negative.   Gastrointestinal:  Negative for abdominal pain.  Genitourinary: Negative.   Musculoskeletal: Negative.  Negative for myalgias.  Skin:  Negative for rash.  Neurological:  Positive for headaches. Negative for tingling, loss of consciousness and weakness.  Endo/Heme/Allergies:  Negative for polydipsia.        Objective    BP (!) 140/86 (BP Location: Left Arm, Patient Position: Sitting, Cuff Size: Large)   Pulse 91   Temp 97.9 F (36.6 C) (Oral)   Ht 5\' 4"  (1.626 m)   Wt 235 lb 9.6 oz (106.9 kg)   SpO2 93% Comment: Long nails  BMI 40.44 kg/m   Physical Exam Constitutional:      General: She is not in acute distress.    Appearance: Normal appearance. She is not ill-appearing, toxic-appearing or diaphoretic.  HENT:     Head: Normocephalic  and atraumatic.     Right Ear: Tympanic membrane, ear canal and external ear normal.     Left Ear: Tympanic membrane, ear canal and external ear normal.     Mouth/Throat:     Mouth: Mucous membranes are moist.     Pharynx: Oropharynx is clear. No oropharyngeal exudate or posterior oropharyngeal erythema.  Eyes:     General: No scleral icterus.       Right eye: No discharge.        Left eye: No discharge.     Extraocular Movements: Extraocular movements intact.     Conjunctiva/sclera: Conjunctivae normal.     Pupils: Pupils are equal, round, and reactive to light.  Cardiovascular:     Rate and Rhythm: Normal rate and regular rhythm.  Pulmonary:     Effort: Pulmonary effort is normal. No respiratory distress.     Breath sounds: Normal breath sounds. No wheezing, rhonchi or rales.  Musculoskeletal:     Cervical back: No rigidity or tenderness.     Right lower leg: No edema.     Left lower leg: No edema.  Lymphadenopathy:     Cervical: No cervical adenopathy.  Skin:    General: Skin is warm and dry.  Neurological:     Mental Status: She is alert and oriented to person, place, and time.  Psychiatric:        Mood and Affect: Mood normal.        Behavior: Behavior normal.         Assessment & Plan:   Healthcare maintenance -     Lipid panel; Future -     Hemoglobin A1c; Future  Elevated BP without diagnosis of hypertension -     Comprehensive metabolic panel; Future -     Urinalysis, Routine w reflex microscopic; Future  Normocytic anemia -     CBC; Future -     TSH; Future -     B12 and Folate Panel; Future -     Iron, TIBC and Ferritin Panel; Future  Folic acid deficiency -     Folic Acid; Take 1 tablet (1 mg total) by mouth daily.  Dispense: 90 tablet; Refill: 2     Return in about 8 weeks (around 04/21/2023), or Check and record blood pressures periodically.  Bring BP cuff with you..  Given information on preventing hypertension.  Return fasting for above ordered  blood work.  Information given on health maintenance and disease prevention.  Mliss Sax, MD  4/30 addendum: with menses for urine collection.

## 2023-02-25 LAB — URINALYSIS, ROUTINE W REFLEX MICROSCOPIC
Bilirubin Urine: NEGATIVE
Ketones, ur: NEGATIVE
Leukocytes,Ua: NEGATIVE
Nitrite: NEGATIVE
Specific Gravity, Urine: 1.02 (ref 1.000–1.030)
Total Protein, Urine: NEGATIVE
Urine Glucose: NEGATIVE
Urobilinogen, UA: 0.2 (ref 0.0–1.0)
pH: 7.5 (ref 5.0–8.0)

## 2023-02-25 LAB — CBC
HCT: 37.6 % (ref 36.0–46.0)
Hemoglobin: 12.2 g/dL (ref 12.0–15.0)
MCHC: 32.6 g/dL (ref 30.0–36.0)
MCV: 81.7 fl (ref 78.0–100.0)
Platelets: 507 10*3/uL — ABNORMAL HIGH (ref 150.0–400.0)
RBC: 4.6 Mil/uL (ref 3.87–5.11)
RDW: 15.7 % — ABNORMAL HIGH (ref 11.5–15.5)
WBC: 6.3 10*3/uL (ref 4.0–10.5)

## 2023-02-25 LAB — TSH: TSH: 1.16 u[IU]/mL (ref 0.35–5.50)

## 2023-02-25 LAB — COMPREHENSIVE METABOLIC PANEL
ALT: 19 U/L (ref 0–35)
AST: 16 U/L (ref 0–37)
Albumin: 3.9 g/dL (ref 3.5–5.2)
Alkaline Phosphatase: 60 U/L (ref 39–117)
BUN: 17 mg/dL (ref 6–23)
CO2: 26 mEq/L (ref 19–32)
Calcium: 8.9 mg/dL (ref 8.4–10.5)
Chloride: 105 mEq/L (ref 96–112)
Creatinine, Ser: 0.91 mg/dL (ref 0.40–1.20)
GFR: 77.18 mL/min (ref >60.00)
Glucose, Bld: 84 mg/dL (ref 70–99)
Potassium: 4.7 mEq/L (ref 3.5–5.1)
Sodium: 137 mEq/L (ref 135–145)
Total Bilirubin: 0.2 mg/dL (ref 0.2–1.2)
Total Protein: 6.8 g/dL (ref 6.0–8.3)

## 2023-02-25 LAB — LIPID PANEL
Cholesterol: 193 mg/dL (ref 0–200)
HDL: 47.9 mg/dL (ref >39.00)
LDL Cholesterol: 126 mg/dL — ABNORMAL HIGH (ref 0–99)
NonHDL: 144.88
Total CHOL/HDL Ratio: 4
Triglycerides: 94 mg/dL (ref 0.0–149.0)
VLDL: 18.8 mg/dL (ref 0.0–40.0)

## 2023-02-25 LAB — HEMOGLOBIN A1C: Hgb A1c MFr Bld: 5.6 % (ref 4.6–6.5)

## 2023-02-25 LAB — B12 AND FOLATE PANEL
Folate: 5.4 ng/mL — ABNORMAL LOW (ref >5.9)
Vitamin B-12: 1219 pg/mL — ABNORMAL HIGH (ref 211–911)

## 2023-02-26 ENCOUNTER — Other Ambulatory Visit (HOSPITAL_COMMUNITY): Payer: Self-pay

## 2023-02-26 MED ORDER — FOLIC ACID 1 MG PO TABS
1.0000 mg | ORAL_TABLET | Freq: Every day | ORAL | 2 refills | Status: DC
  Filled 2023-02-26: qty 90, 90d supply, fill #0
  Filled 2023-07-15: qty 90, 90d supply, fill #1

## 2023-02-26 NOTE — Addendum Note (Signed)
Addended by: Andrez Grime on: 02/26/2023 09:34 AM   Modules accepted: Orders

## 2023-02-27 DIAGNOSIS — J3081 Allergic rhinitis due to animal (cat) (dog) hair and dander: Secondary | ICD-10-CM | POA: Diagnosis not present

## 2023-02-27 DIAGNOSIS — J301 Allergic rhinitis due to pollen: Secondary | ICD-10-CM | POA: Diagnosis not present

## 2023-02-27 DIAGNOSIS — J3089 Other allergic rhinitis: Secondary | ICD-10-CM | POA: Diagnosis not present

## 2023-03-05 DIAGNOSIS — J3081 Allergic rhinitis due to animal (cat) (dog) hair and dander: Secondary | ICD-10-CM | POA: Diagnosis not present

## 2023-03-05 DIAGNOSIS — J301 Allergic rhinitis due to pollen: Secondary | ICD-10-CM | POA: Diagnosis not present

## 2023-03-05 DIAGNOSIS — J3089 Other allergic rhinitis: Secondary | ICD-10-CM | POA: Diagnosis not present

## 2023-03-11 ENCOUNTER — Telehealth: Payer: Self-pay | Admitting: Family Medicine

## 2023-03-11 NOTE — Telephone Encounter (Signed)
FYI message below.

## 2023-03-11 NOTE — Telephone Encounter (Signed)
Pt called in wanting Dr Doreene Burke to know she is taking folic acid and she was on her cycle when she came in to do her labs.  Pt doesn't need a cb unless you need to speak with her.

## 2023-03-17 DIAGNOSIS — J3081 Allergic rhinitis due to animal (cat) (dog) hair and dander: Secondary | ICD-10-CM | POA: Diagnosis not present

## 2023-03-17 DIAGNOSIS — J3089 Other allergic rhinitis: Secondary | ICD-10-CM | POA: Diagnosis not present

## 2023-03-17 DIAGNOSIS — J301 Allergic rhinitis due to pollen: Secondary | ICD-10-CM | POA: Diagnosis not present

## 2023-03-21 DIAGNOSIS — J3089 Other allergic rhinitis: Secondary | ICD-10-CM | POA: Diagnosis not present

## 2023-03-21 DIAGNOSIS — J301 Allergic rhinitis due to pollen: Secondary | ICD-10-CM | POA: Diagnosis not present

## 2023-03-21 DIAGNOSIS — J3081 Allergic rhinitis due to animal (cat) (dog) hair and dander: Secondary | ICD-10-CM | POA: Diagnosis not present

## 2023-03-24 DIAGNOSIS — E288 Other ovarian dysfunction: Secondary | ICD-10-CM | POA: Diagnosis not present

## 2023-03-24 DIAGNOSIS — N971 Female infertility of tubal origin: Secondary | ICD-10-CM | POA: Diagnosis not present

## 2023-04-01 DIAGNOSIS — J3081 Allergic rhinitis due to animal (cat) (dog) hair and dander: Secondary | ICD-10-CM | POA: Diagnosis not present

## 2023-04-01 DIAGNOSIS — J301 Allergic rhinitis due to pollen: Secondary | ICD-10-CM | POA: Diagnosis not present

## 2023-04-01 DIAGNOSIS — J3089 Other allergic rhinitis: Secondary | ICD-10-CM | POA: Diagnosis not present

## 2023-04-10 DIAGNOSIS — J3081 Allergic rhinitis due to animal (cat) (dog) hair and dander: Secondary | ICD-10-CM | POA: Diagnosis not present

## 2023-04-10 DIAGNOSIS — J301 Allergic rhinitis due to pollen: Secondary | ICD-10-CM | POA: Diagnosis not present

## 2023-04-10 DIAGNOSIS — J3089 Other allergic rhinitis: Secondary | ICD-10-CM | POA: Diagnosis not present

## 2023-04-30 ENCOUNTER — Other Ambulatory Visit (HOSPITAL_COMMUNITY): Payer: Self-pay

## 2023-05-01 DIAGNOSIS — J3081 Allergic rhinitis due to animal (cat) (dog) hair and dander: Secondary | ICD-10-CM | POA: Diagnosis not present

## 2023-05-01 DIAGNOSIS — J301 Allergic rhinitis due to pollen: Secondary | ICD-10-CM | POA: Diagnosis not present

## 2023-05-01 DIAGNOSIS — J3089 Other allergic rhinitis: Secondary | ICD-10-CM | POA: Diagnosis not present

## 2023-05-02 ENCOUNTER — Ambulatory Visit: Payer: Commercial Managed Care - PPO | Admitting: Family Medicine

## 2023-05-02 ENCOUNTER — Other Ambulatory Visit (HOSPITAL_COMMUNITY): Payer: Self-pay

## 2023-05-02 ENCOUNTER — Encounter: Payer: Self-pay | Admitting: Family Medicine

## 2023-05-02 ENCOUNTER — Telehealth: Payer: Self-pay | Admitting: Family Medicine

## 2023-05-02 VITALS — BP 124/84 | HR 92 | Ht 64.0 in | Wt 239.0 lb

## 2023-05-02 DIAGNOSIS — M7551 Bursitis of right shoulder: Secondary | ICD-10-CM | POA: Diagnosis not present

## 2023-05-02 MED ORDER — MELOXICAM 7.5 MG PO TABS
7.5000 mg | ORAL_TABLET | Freq: Every day | ORAL | 0 refills | Status: DC
  Filled 2023-05-02: qty 30, 30d supply, fill #0

## 2023-05-02 MED ORDER — TRIAMCINOLONE ACETONIDE 40 MG/ML IJ SUSP
40.0000 mg | Freq: Once | INTRAMUSCULAR | Status: AC
  Administered 2023-05-02: 40 mg via INTRA_ARTICULAR

## 2023-05-02 NOTE — Progress Notes (Signed)
Established Patient Office Visit   Subjective:  Patient ID: [name], female    DOB: [birth year]  Age: 42 y.o. MRN: [id number]  Chief Complaint  Patient presents with   Shoulder Pain    Right shoulder pain x 1 month. Pain radiates to the back of her neck.     Shoulder Pain  Pertinent negatives include no tingling.   Encounter Diagnoses  Name Primary?   Bursitis of right shoulder Yes   6-week history of right shoulder pain.  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  No traumatic injury.  She has tried Aspercreme Tylenol and Advil with only temporary relief.  She is a side sleeper and it wakes her up when she rolls over on it.  Patient was on menses for her last urinalysis.   Review of Systems  Constitutional: Negative.   HENT: Negative.    Eyes:  Negative for blurred vision, discharge and redness.  Respiratory: Negative.    Cardiovascular: Negative.   Gastrointestinal:  Negative for abdominal pain.  Genitourinary: Negative.   Musculoskeletal:  Positive for joint pain. Negative for myalgias.  Skin:  Negative for rash.  Neurological:  Negative for tingling, loss of consciousness and weakness.  Endo/Heme/Allergies:  Negative for polydipsia.     Current Outpatient Medications:    azelastine (ASTELIN) 0.1 % nasal spray, Place 2 sprays into both nostrils 2 (two) times daily. Use in each nostril as directed, Disp: 30 mL, Rfl: 0   folic acid (FOLVITE) 1 MG tablet, Take 1 tablet (1 mg total) by mouth daily., Disp: 90 tablet, Rfl: 2   meloxicam (MOBIC) 7.5 MG tablet, Take 1 tablet (7.5 mg total) by mouth daily. For 14 days., Disp: 30 tablet, Rfl: 0   OMEPRAZOLE PO, Take by mouth., Disp: , Rfl:    Ondansetron HCl (ZOFRAN PO), Take by mouth., Disp: , Rfl:   Current Facility-Administered Medications:    triamcinolone acetonide (KENALOG-40) injection 40 mg, 40 mg, Intra-articular, Once, Mliss Sax, MD    Objective:     BP 124/84   Pulse 92   Ht 5\' 4"  (1.626 m)   Wt 239 lb (108.4 kg)   SpO2 99%   BMI 41.02 kg/m    Physical Exam Constitutional:      General: She is not in acute distress.    Appearance: Normal appearance. She is not ill-appearing, toxic-appearing or diaphoretic.  HENT:     Head: Normocephalic and atraumatic.     Right Ear: External ear normal.     Left Ear: External ear normal.  Eyes:     General: No scleral icterus.       Right eye: No discharge.        Left eye: No discharge.     Extraocular Movements: Extraocular movements intact.     Conjunctiva/sclera: Conjunctivae normal.  Pulmonary:     Effort: Pulmonary effort is normal. No respiratory distress.  Musculoskeletal:     Right shoulder: Tenderness present. Normal range of motion. Normal strength.       Arms:  Skin:    General: Skin is warm and dry.  Neurological:     Mental Status: She is alert and oriented to person, place, and time.  Psychiatric:        Mood and Affect: Mood normal.        Behavior: Behavior normal.   Aspiration/Injection Procedure Note ELLIYAH LISZEWSKI [id number]  July 21, 1981  Procedure: Injection Indications: Chronic ongoing pain.  Procedure Details Consent: Risks of procedure as well as the alternatives and risks of each were explained to the (patient/caregiver).  Consent for procedure obtained. Time Out: Verified patient identification, verified procedure, site/side was marked, verified correct patient position, special equipment/implants available, medications/allergies/relevent history reviewed, required imaging and test results available.  Performed  Lateral posterior AC joint identified and marked.  Cleansed with Betadine x 3 and wiped clear with alcohol.  Injected with 2 cc of lidocaine and 1 cc of Kenalog 40. Local Anesthesia Used: NA Amount of Fluid Aspirated:  NA Character of Fluid:  NA Fluid was sent for: NA A sterile dressing was applied.  Patient did tolerate  procedure well.  Reported relief of pain after injection Estimated blood loss: 0 cc  Mliss Sax 05/02/2023, 1:44 PM    No results found for any visits on 05/02/23.    The 10-year ASCVD risk score (Arnett DK, et al., 2019) is: 0.9%    Assessment & Plan:   Bursitis of right shoulder -     Meloxicam; Take 1 tablet (7.5 mg total) by mouth daily. For 14 days.  Dispense: 30 tablet; Refill: 0 -     Triamcinolone Acetonide    Return if symptoms worsen or fail to improve.  Discussed the possibility of an inflammatory reaction to the Kenalog.  It should only last a day and then resolve.  With increasing pain erythema and/or fever, proceed to emergency room  Mliss Sax, MD

## 2023-05-02 NOTE — Telephone Encounter (Signed)
Pt was seen on 05/02/23 by Dr Doreene Burke and was prescribed Rx #: [id number]  meloxicam (MOBIC) 7.5 MG tablet [[id number]. She is wanting something similar to this because she has IBS. She feels Mobic will irritate her stomach.   [facility] [address], [place] [address] Phone: [phone]  Fax: [phone] DEA #: [id number]

## 2023-05-07 DIAGNOSIS — J301 Allergic rhinitis due to pollen: Secondary | ICD-10-CM | POA: Diagnosis not present

## 2023-05-07 DIAGNOSIS — J3081 Allergic rhinitis due to animal (cat) (dog) hair and dander: Secondary | ICD-10-CM | POA: Diagnosis not present

## 2023-05-07 DIAGNOSIS — J3089 Other allergic rhinitis: Secondary | ICD-10-CM | POA: Diagnosis not present

## 2023-05-09 DIAGNOSIS — J3081 Allergic rhinitis due to animal (cat) (dog) hair and dander: Secondary | ICD-10-CM | POA: Diagnosis not present

## 2023-05-09 DIAGNOSIS — J301 Allergic rhinitis due to pollen: Secondary | ICD-10-CM | POA: Diagnosis not present

## 2023-05-09 DIAGNOSIS — J3089 Other allergic rhinitis: Secondary | ICD-10-CM | POA: Diagnosis not present

## 2023-05-16 ENCOUNTER — Telehealth: Payer: Commercial Managed Care - PPO | Admitting: Nurse Practitioner

## 2023-05-16 ENCOUNTER — Other Ambulatory Visit (HOSPITAL_COMMUNITY): Payer: Self-pay

## 2023-05-16 DIAGNOSIS — R110 Nausea: Secondary | ICD-10-CM | POA: Diagnosis not present

## 2023-05-16 MED ORDER — ONDANSETRON 4 MG PO TBDP
4.0000 mg | ORAL_TABLET | Freq: Three times a day (TID) | ORAL | 0 refills | Status: DC | PRN
  Filled 2023-05-16: qty 20, 7d supply, fill #0

## 2023-05-16 NOTE — Progress Notes (Signed)
E-Visit for Nausea   We are sorry that you are not feeling well. Here is how we plan to help!  Based on what you have shared with me it looks like you have a nausea related to your migraine headache. When we treat short term symptoms, we always caution that any symptoms that persist should be fully evaluated in a medical office.  I have prescribed a medication that will help alleviate your symptoms and allow you to stay hydrated:  Zofran 4 mg 1 tablet every 8 hours as needed for nausea and vomiting  HOME CARE: Drink clear liquids.  This is very important! Dehydration (the lack of fluid) can lead to a serious complication.  Start off with 1 tablespoon every 5 minutes for 8 hours. You may begin eating bland foods after 8 hours without vomiting.  Start with saltine crackers, white bread, rice, mashed potatoes, applesauce. After 48 hours on a bland diet, you may resume a normal diet. Try to go to sleep.  Sleep often empties the stomach and relieves the need to vomit.  GET HELP RIGHT AWAY IF:  Your symptoms do not improve or worsen within 2 days after treatment. You have a fever for over 3 days. You cannot keep down fluids after trying the medication.  MAKE SURE YOU:  Understand these instructions. Will watch your condition. Will get help right away if you are not doing well or get worse.    Thank you for choosing an e-visit.  Your e-visit answers were reviewed by a board certified advanced clinical practitioner to complete your personal care plan. Depending upon the condition, your plan could have included both over the counter or prescription medications.  Please review your pharmacy choice. Make sure the pharmacy is open so you can pick up prescription now. If there is a problem, you may contact your provider through [employer] and have the prescription routed to another pharmacy.  Your safety is important to [place]. If you have drug allergies check your prescription carefully.    For the next 24 hours you can use MyChart to ask questions about today's visit, request a non-urgent call back, or ask for a work or school excuse. You will get an email in the next two days asking about your experience. I hope that your e-visit has been valuable and will speed your recovery.   Meds ordered this encounter  Medications   ondansetron (ZOFRAN-ODT) 4 MG disintegrating tablet    Sig: Take 1 tablet (4 mg total) by mouth every 8 (eight) hours as needed for nausea or vomiting.    Dispense:  20 tablet    Refill:  0     I spent approximately 5 minutes reviewing the patient's history, current symptoms and coordinating their care today.

## 2023-05-21 ENCOUNTER — Other Ambulatory Visit (HOSPITAL_COMMUNITY): Payer: Self-pay

## 2023-05-21 DIAGNOSIS — J301 Allergic rhinitis due to pollen: Secondary | ICD-10-CM | POA: Diagnosis not present

## 2023-05-21 DIAGNOSIS — J3081 Allergic rhinitis due to animal (cat) (dog) hair and dander: Secondary | ICD-10-CM | POA: Diagnosis not present

## 2023-05-21 DIAGNOSIS — J3089 Other allergic rhinitis: Secondary | ICD-10-CM | POA: Diagnosis not present

## 2023-05-30 DIAGNOSIS — J301 Allergic rhinitis due to pollen: Secondary | ICD-10-CM | POA: Diagnosis not present

## 2023-05-30 DIAGNOSIS — J3089 Other allergic rhinitis: Secondary | ICD-10-CM | POA: Diagnosis not present

## 2023-05-30 DIAGNOSIS — J3081 Allergic rhinitis due to animal (cat) (dog) hair and dander: Secondary | ICD-10-CM | POA: Diagnosis not present

## 2023-06-06 ENCOUNTER — Telehealth: Payer: Self-pay | Admitting: Family Medicine

## 2023-06-06 NOTE — Telephone Encounter (Signed)
Caller Name: Malashia Mercil Ph #: [phone] Chief Complaint: Having swelling in her left leg below her knee into her foot. She is experiencing pain when walking and now all the time. She tried compression stockings but did not help.   This call was transferred to Nurse Triage/Access Nurse. This is for documentation purposes. No follow up required at this time.

## 2023-06-07 ENCOUNTER — Ambulatory Visit (HOSPITAL_BASED_OUTPATIENT_CLINIC_OR_DEPARTMENT_OTHER)
Admission: RE | Admit: 2023-06-07 | Discharge: 2023-06-07 | Disposition: A | Discharge status: Home / Self Care | Payer: Commercial Managed Care - PPO | Source: Ambulatory Visit | Attending: Emergency Medicine | Admitting: Emergency Medicine

## 2023-06-07 ENCOUNTER — Other Ambulatory Visit (HOSPITAL_BASED_OUTPATIENT_CLINIC_OR_DEPARTMENT_OTHER): Payer: Self-pay | Admitting: Emergency Medicine

## 2023-06-07 ENCOUNTER — Other Ambulatory Visit: Payer: Self-pay

## 2023-06-07 ENCOUNTER — Ambulatory Visit (HOSPITAL_BASED_OUTPATIENT_CLINIC_OR_DEPARTMENT_OTHER): Admit: 2023-06-07 | Payer: Commercial Managed Care - PPO

## 2023-06-07 ENCOUNTER — Encounter (HOSPITAL_BASED_OUTPATIENT_CLINIC_OR_DEPARTMENT_OTHER): Payer: Self-pay

## 2023-06-07 ENCOUNTER — Emergency Department (HOSPITAL_BASED_OUTPATIENT_CLINIC_OR_DEPARTMENT_OTHER)
Admission: EM | Admit: 2023-06-07 | Discharge: 2023-06-07 | Disposition: A | Discharge status: Home / Self Care | Payer: Commercial Managed Care - PPO | Attending: Emergency Medicine | Admitting: Emergency Medicine

## 2023-06-07 DIAGNOSIS — M79662 Pain in left lower leg: Secondary | ICD-10-CM | POA: Diagnosis not present

## 2023-06-07 DIAGNOSIS — M7989 Other specified soft tissue disorders: Secondary | ICD-10-CM | POA: Diagnosis not present

## 2023-06-07 DIAGNOSIS — M79605 Pain in left leg: Secondary | ICD-10-CM

## 2023-06-07 MED ORDER — ENOXAPARIN SODIUM 100 MG/ML IJ SOSY
100.0000 mg | PREFILLED_SYRINGE | Freq: Once | INTRAMUSCULAR | Status: AC
  Administered 2023-06-07: 100 mg via SUBCUTANEOUS
  Filled 2023-06-07: qty 1

## 2023-06-07 NOTE — Discharge Instructions (Signed)
Return at the given time for an ultrasound of your leg to rule out a blood clot.  Return to the ER if symptoms worsen or change.

## 2023-06-07 NOTE — ED Provider Notes (Signed)
[place] EMERGENCY DEPARTMENT AT [facility] Provider Note   CSN: [id number] Arrival date & time: 06/07/23  0102     History  Chief Complaint  Patient presents with   Leg Swelling    [name] is a 42 y.o. female.  Patient is a 42 year old female with past medical history of anemia and GERD.  Patient presenting today with complaints of pain in her left lower leg.  She describes pain and swelling for the past 2 days in her calf that is worse when she ambulates.  No chest pain or difficulty breathing.  No fevers or chills.  No alleviating factors.  The history is provided by the patient.       Home Medications Prior to Admission medications   Medication Sig Start Date End Date Taking? Authorizing Provider  azelastine (ASTELIN) 0.1 % nasal spray Place 2 sprays into both nostrils 2 (two) times daily. Use in each nostril as directed 03/14/22   Freddy Finner, NP  folic acid (FOLVITE) 1 MG tablet Take 1 tablet (1 mg total) by mouth daily. 02/26/23   Mliss Sax, MD  meloxicam (MOBIC) 7.5 MG tablet Take 1 tablet (7.5 mg total) by mouth daily for 14 days. 05/02/23   Mliss Sax, MD  OMEPRAZOLE PO Take by mouth.    [provider]  ondansetron (ZOFRAN-ODT) 4 MG disintegrating tablet Dissolve 1 tablet (4 mg total) in mouth every 8 (eight) hours as needed for nausea or vomiting. 05/16/23   Viviano Simas, FNP  Ondansetron HCl (ZOFRAN PO) Take by mouth.    [provider]  estradiol (ESTRACE) 1 MG tablet Take 1 mg by mouth 2 (two) times daily.  12/29/14  [provider]  progesterone (PROMETRIUM) 200 MG capsule Take 400 mg by mouth at bedtime.  12/29/14  [provider]      Allergies    Amoxicillin, Codeine, Hydrocodone, Penicillins, and Tylenol with codeine #3 [acetaminophen-codeine]    Review of Systems   Review of Systems  All other systems reviewed and are negative.   Physical Exam Updated Vital Signs BP (!)  148/95   Pulse 69   Temp 98.1 F (36.7 C)   Resp 20   Ht 5\' 4"  (1.626 m)   Wt 102.1 kg   LMP 06/06/2023 (Exact Date)   SpO2 98%   BMI 38.62 kg/m  Physical Exam Vitals and nursing note reviewed.  Constitutional:      Appearance: Normal appearance.  HENT:     Head: Normocephalic.  Pulmonary:     Effort: Pulmonary effort is normal.  Musculoskeletal:     Comments: The left lower leg is noted to have swelling from the knee to the ankle/foot.  There is tenderness to palpation in the left calf.  Denna Haggard' sign is positive.  Skin:    General: Skin is warm and dry.  Neurological:     Mental Status: She is alert and oriented to person, place, and time.     ED Results / Procedures / Treatments   Labs (all labs ordered are listed, but only abnormal results are displayed) Labs Reviewed - No data to display  EKG None  Radiology No results found.  Procedures Procedures    Medications Ordered in ED Medications  enoxaparin (LOVENOX) injection 100 mg (has no administration in time range)    ED Course/ Medical Decision Making/ A&P  Patient presenting with left calf pain and lower leg swelling concerning for DVT.  Ultrasound is gone for  the evening, so patient will return in the morning for an ultrasound.  Lovenox given in the meantime.  Final Clinical Impression(s) / ED Diagnoses Final diagnoses:  None    Rx / DC Orders ED Discharge Orders     None         Geoffery Lyons, MD 06/07/23 [phone]

## 2023-06-07 NOTE — ED Triage Notes (Addendum)
POV from home, A&O x 4, GCS 15, amb to triage  3 days of left lower extremity swelling, tried compression socks without improvement, no hx of DVT. No wounds or redness. Tender to palpation on posterior calf.

## 2023-06-20 ENCOUNTER — Other Ambulatory Visit: Payer: Self-pay

## 2023-06-20 ENCOUNTER — Other Ambulatory Visit (HOSPITAL_COMMUNITY): Payer: Self-pay

## 2023-06-20 ENCOUNTER — Encounter: Payer: Self-pay | Admitting: Family Medicine

## 2023-06-20 ENCOUNTER — Ambulatory Visit: Payer: Commercial Managed Care - PPO | Admitting: Family Medicine

## 2023-06-20 VITALS — BP 148/100 | HR 72 | Temp 98.2°F | Ht 64.0 in | Wt 237.0 lb

## 2023-06-20 DIAGNOSIS — I10 Essential (primary) hypertension: Secondary | ICD-10-CM

## 2023-06-20 DIAGNOSIS — Z6841 Body Mass Index (BMI) 40.0 and over, adult: Secondary | ICD-10-CM | POA: Diagnosis not present

## 2023-06-20 MED ORDER — HYDROCHLOROTHIAZIDE 25 MG PO TABS
25.0000 mg | ORAL_TABLET | Freq: Every day | ORAL | 0 refills | Status: DC
  Filled 2023-06-20 (×2): qty 90, 90d supply, fill #0

## 2023-06-20 MED ORDER — HYDROCHLOROTHIAZIDE 25 MG PO TABS
25.0000 mg | ORAL_TABLET | Freq: Every day | ORAL | 3 refills | Status: DC
  Filled 2023-06-20: qty 90, 90d supply, fill #0

## 2023-06-20 MED ORDER — SPIRONOLACTONE 25 MG PO TABS
25.0000 mg | ORAL_TABLET | Freq: Every day | ORAL | 0 refills | Status: DC
  Filled 2023-06-20: qty 90, 90d supply, fill #0

## 2023-06-20 NOTE — Progress Notes (Addendum)
Established Patient Office Visit   Subjective:  Patient ID: [name], female    DOB: 1981/02/17  Age: 42 y.o. MRN: [id number]  Chief Complaint  Patient presents with   Edema    Bilateral lower extremity edema and pain x 2 weeks. Pt has headache, neck stiffness. Pt went to ER and had [place] on lower extremities.     HPI Encounter Diagnoses  Name Primary?   Essential hypertension Yes   Morbid obesity with BMI of 40.0-44.9, adult (HCC)    For follow-up of elevated blood pressure.  Strong family history of pressure with both of her parents affected.  She worries about her brother in [place] who recently developed what sounds like an AKI and also has high blood pressure.  She does not smoke drink alcohol or use illicit drugs.  She was seen in the emergency room for bilateral lower extremity swelling.  Venous compression testing was negative.  She also has been experiencing some generalized headaches.   Review of Systems  Constitutional: Negative.   HENT: Negative.    Eyes:  Negative for blurred vision, discharge and redness.  Respiratory: Negative.    Cardiovascular: Negative.   Gastrointestinal:  Negative for abdominal pain.  Genitourinary: Negative.   Musculoskeletal: Negative.  Negative for myalgias.  Skin:  Negative for rash.  Neurological:  Negative for tingling, loss of consciousness and weakness.  Endo/Heme/Allergies:  Negative for polydipsia.     Current Outpatient Medications:    azelastine (ASTELIN) 0.1 % nasal spray, Place 2 sprays into both nostrils 2 (two) times daily. Use in each nostril as directed, Disp: 30 mL, Rfl: 0   folic acid (FOLVITE) 1 MG tablet, Take 1 tablet (1 mg total) by mouth daily., Disp: 90 tablet, Rfl: 2   meloxicam (MOBIC) 7.5 MG tablet, Take 1 tablet (7.5 mg total) by mouth daily for 14 days., Disp: 30 tablet, Rfl: 0   OMEPRAZOLE PO, Take by mouth., Disp: , Rfl:    ondansetron (ZOFRAN-ODT) 4 MG disintegrating tablet, Dissolve 1 tablet (4 mg  total) in mouth every 8 (eight) hours as needed for nausea or vomiting., Disp: 20 tablet, Rfl: 0   hydrochlorothiazide (HYDRODIURIL) 25 MG tablet, Take 1 tablet (25 mg total) by mouth daily., Disp: 90 tablet, Rfl: 0   Objective:     BP (!) 148/100   Pulse 72   Temp 98.2 F (36.8 C)   Ht 5\' 4"  (1.626 m)   Wt 237 lb (107.5 kg)   LMP 06/06/2023 (Exact Date)   SpO2 94%   BMI 40.68 kg/m  BP Readings from Last 3 Encounters:  06/20/23 (!) 148/100  06/07/23 (!) 148/95  05/02/23 124/84   Wt Readings from Last 3 Encounters:  06/20/23 237 lb (107.5 kg)  06/07/23 225 lb (102.1 kg)  05/02/23 239 lb (108.4 kg)      Physical Exam Constitutional:      General: She is not in acute distress.    Appearance: Normal appearance. She is not ill-appearing, toxic-appearing or diaphoretic.  HENT:     Head: Normocephalic and atraumatic.     Right Ear: External ear normal.     Left Ear: External ear normal.     Mouth/Throat:     Mouth: Mucous membranes are moist.     Pharynx: Oropharynx is clear. No oropharyngeal exudate or posterior oropharyngeal erythema.  Eyes:     General: No scleral icterus.       Right eye: No discharge.  Left eye: No discharge.     Extraocular Movements: Extraocular movements intact.     Conjunctiva/sclera: Conjunctivae normal.     Pupils: Pupils are equal, round, and reactive to light.  Cardiovascular:     Rate and Rhythm: Normal rate and regular rhythm.  Pulmonary:     Effort: Pulmonary effort is normal. No respiratory distress.     Breath sounds: Normal breath sounds.  Abdominal:     General: Bowel sounds are normal.     Tenderness: There is no abdominal tenderness. There is no guarding.  Musculoskeletal:     Cervical back: No rigidity or tenderness.     Right lower leg: No edema.     Left lower leg: No edema.     Comments: There is some swelling in her lower extremities without edema.  Skin:    General: Skin is warm and dry.  Neurological:      Mental Status: She is alert and oriented to person, place, and time.  Psychiatric:        Mood and Affect: Mood normal.        Behavior: Behavior normal.      No results found for any visits on 06/20/23.    The 10-year ASCVD risk score (Arnett DK, et al., 2019) is: 5.4%    Assessment & Plan:   Essential hypertension -     hydroCHLOROthiazide; Take 1 tablet (25 mg total) by mouth daily.  Dispense: 90 tablet; Refill: 0  Morbid obesity with BMI of 40.0-44.9, adult (HCC)    Return in about 6 weeks (around 08/01/2023).  Will start HCTZ for hypertension.  Warned about frequency of urination.  Advised that this commonly resolves for many patients.  Information on managing hypertension was also given.  Also given information on calorie counting to lose weight.  Advised also than exercise such as walking for 30 minutes daily would also be helpful. Mliss Sax, MD

## 2023-11-12 ENCOUNTER — Telehealth: Payer: Commercial Managed Care - PPO | Admitting: Physician Assistant

## 2023-11-12 DIAGNOSIS — N898 Other specified noninflammatory disorders of vagina: Secondary | ICD-10-CM

## 2023-11-12 DIAGNOSIS — R109 Unspecified abdominal pain: Secondary | ICD-10-CM

## 2023-11-12 DIAGNOSIS — R102 Pelvic and perineal pain: Secondary | ICD-10-CM

## 2023-11-12 NOTE — Progress Notes (Signed)
 Because you are having belly pain and vaginal pain with a foul smelling discharge and having a new sexual partner in the last couple of months, I feel your condition warrants further evaluation and I recommend that you be seen in a face to face visit for formal evaluation and testing.   NOTE: There will be NO CHARGE for this eVisit   If you are having a true medical emergency please call 911.      For an urgent face to face visit, [place] has eight urgent care centers for your convenience:   NEW!! [facility] Urgent Care Center at [facility] Get Driving Directions [phone] [address], [address] [place], [address]    [facility] Health Urgent Care Center at [facility] Get Driving Directions [phone] [address] [place], [place] [address]   [facility] Center [facility]) Get Driving Directions [phone] [address] [place], [place] [address]  [facility]) Get Driving Directions [phone] [address] [place],  [place]  [address]  [facility] Care Center [facility] - at [employer]  [phone] [address] [place],  [place] [address]   [facility] Health Urgent Care at [facility] Get Driving Directions [phone] [address] [place], [place] [address]   [facility] Health Urgent Care at [facility] Get Driving Directions  [phone] [address] [place], [place] [address]   [facility] Health Urgent Care at [facility] Directions [phone] [address], [address] [place], [place] [address]  Your MyChart E-visit questionnaire answers were reviewed by a board certified advanced clinical practitioner to complete your personal care plan based on your specific symptoms.  Thank you for using e-Visits.     I have spent 5 minutes in review of e-visit questionnaire,  review and updating patient chart, medical decision making and response to patient.   Delon CHRISTELLA Dickinson, PA-C

## 2023-11-13 ENCOUNTER — Other Ambulatory Visit (HOSPITAL_COMMUNITY): Payer: Self-pay

## 2023-11-13 ENCOUNTER — Ambulatory Visit
Admission: EM | Admit: 2023-11-13 | Discharge: 2023-11-13 | Disposition: A | Discharge status: Home / Self Care | Payer: Commercial Managed Care - PPO | Attending: Physician Assistant | Admitting: Physician Assistant

## 2023-11-13 DIAGNOSIS — N939 Abnormal uterine and vaginal bleeding, unspecified: Secondary | ICD-10-CM | POA: Diagnosis not present

## 2023-11-13 DIAGNOSIS — B9689 Other specified bacterial agents as the cause of diseases classified elsewhere: Secondary | ICD-10-CM | POA: Diagnosis not present

## 2023-11-13 DIAGNOSIS — N760 Acute vaginitis: Secondary | ICD-10-CM | POA: Insufficient documentation

## 2023-11-13 LAB — POCT URINALYSIS DIP (MANUAL ENTRY)
Bilirubin, UA: NEGATIVE
Glucose, UA: NEGATIVE mg/dL
Ketones, POC UA: NEGATIVE mg/dL
Leukocytes, UA: NEGATIVE
Nitrite, UA: NEGATIVE
Protein Ur, POC: NEGATIVE mg/dL
Spec Grav, UA: 1.02 (ref 1.010–1.025)
Urobilinogen, UA: 0.2 U/dL
pH, UA: 6 (ref 5.0–8.0)

## 2023-11-13 LAB — CERVICOVAGINAL ANCILLARY ONLY
Bacterial Vaginitis (gardnerella): POSITIVE — AB
Candida Glabrata: NEGATIVE
Candida Vaginitis: NEGATIVE
Chlamydia: NEGATIVE
Comment: NEGATIVE
Comment: NEGATIVE
Comment: NEGATIVE
Comment: NEGATIVE
Comment: NEGATIVE
Comment: NORMAL
Neisseria Gonorrhea: NEGATIVE
Trichomonas: NEGATIVE

## 2023-11-13 MED ORDER — METRONIDAZOLE 500 MG PO TABS
500.0000 mg | ORAL_TABLET | Freq: Two times a day (BID) | ORAL | 0 refills | Status: AC
  Filled 2023-11-13: qty 14, 7d supply, fill #0

## 2023-11-13 MED ORDER — DICLOFENAC SODIUM 75 MG PO TBEC
75.0000 mg | DELAYED_RELEASE_TABLET | Freq: Two times a day (BID) | ORAL | 0 refills | Status: DC
  Filled 2023-11-13: qty 20, 10d supply, fill #0

## 2023-11-13 NOTE — ED Triage Notes (Addendum)
 Patient presents vaginal spotting in between periods, vaginal odor, pain with intercourse, low back pain and abdomen sore to touch x 1 weeks. Treated with otc bv pill and Tylenol.

## 2023-11-13 NOTE — Discharge Instructions (Addendum)
Follow up with your Gynecologist for recheck if symptoms persist

## 2023-11-13 NOTE — ED Provider Notes (Addendum)
 [facility]    CSN: [id number] Arrival date & time: 11/13/23  1056      History   Chief Complaint No chief complaint on file.   HPI [name] is a 43 y.o. female.   Patient complains of general discomfort.  Patient reports that she noticed some vaginal bleeding.  Patient reports that she has just had her menstrual cycle and should not have vaginal bleeding.  Patient spoke to a telemed provider yesterday and was advised that she needed testing.  Patient reports that she has some lower abdominal discomfort.  Patient denies any fever or chills she has not any nausea vomiting or diarrhea.  The history is provided by the patient. No language interpreter was used.    History reviewed. No pertinent past medical history.  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      Home Medications    Prior to Admission medications   Medication Sig Start Date End Date Taking? Authorizing Provider  diclofenac  (VOLTAREN ) 75 MG EC tablet Take 1 tablet (75 mg total) by mouth 2 (two) times daily. 11/13/23  Yes Yuka Lallier K, PA-C  metroNIDAZOLE  (FLAGYL ) 500 MG tablet Take 1 tablet (500 mg total) by mouth 2 (two) times daily for 7 days. 11/13/23 11/20/23 Yes Ruthellen Tippy K, PA-C  azelastine  (ASTELIN ) 0.1 % nasal  spray Place 2 sprays into both nostrils 2 (two) times daily. Use in each nostril as directed 03/14/22   Moishe Chiquita CHRISTELLA, NP  folic acid  (FOLVITE ) 1 MG tablet Take 1 tablet (1 mg total) by mouth daily. 02/26/23   Berneta Elsie Sayre, MD  hydrochlorothiazide  (HYDRODIURIL ) 25 MG tablet Take 1 tablet (25 mg total) by mouth daily. 06/20/23   Berneta Elsie Sayre, MD  meloxicam  (MOBIC ) 7.5 MG tablet Take 1 tablet (7.5 mg total) by mouth daily for 14 days. 05/02/23   Berneta Elsie Sayre, MD  OMEPRAZOLE  PO Take by mouth.    [provider]  ondansetron  (ZOFRAN -ODT) 4 MG disintegrating tablet Dissolve 1 tablet (4 mg total) in mouth every 8 (eight) hours as needed for nausea or vomiting. 05/16/23   Kennyth Domino, FNP  estradiol (ESTRACE) 1 MG tablet Take 1 mg by mouth 2 (two) times daily.  12/29/14  [provider]  progesterone (PROMETRIUM) 200 MG capsule Take 400 mg by mouth at bedtime.  12/29/14  [provider]    Family History Family History  Problem Relation Age of Onset   Hypertension Mother    Hypertension Father     Social History Social History   Tobacco Use   Smoking status: Some Days    Types: Cigars   Smokeless tobacco: Never  Vaping Use   Vaping status: Never Used  Substance Use Topics   Alcohol use: Yes  Comment: denies   Drug use: No     Allergies   Amoxicillin, Codeine, Hydrocodone, Penicillins, and Tylenol  with codeine #3 [acetaminophen -codeine]   Review of Systems Review of Systems  All other systems reviewed and are negative.    Physical Exam Triage Vital Signs ED Triage Vitals  Encounter Vitals Group     BP 11/13/23 1226 (!) 150/90     Systolic BP Percentile --      Diastolic BP Percentile --      Pulse Rate 11/13/23 1226 84     Resp 11/13/23 1226 16     Temp 11/13/23 1226 98.1 F (36.7 C)     Temp Source 11/13/23 1226 Oral     SpO2 11/13/23 1226 100 %     Weight 11/13/23 1224 225 lb (102.1 kg)     Height 11/13/23 1224 5'  4 (1.626 m)     Head Circumference --      Peak Flow --      Pain Score 11/13/23 1224 3     Pain Loc --      Pain Education --      Exclude from Growth Chart --    No data found.  Updated Vital Signs BP (!) 150/90 (BP Location: Left Arm)   Pulse 84   Temp 98.1 F (36.7 C) (Oral)   Resp 16   Ht 5' 4 (1.626 m)   Wt 102.1 kg   LMP 11/02/2023 (Exact Date)   SpO2 100%   BMI 38.62 kg/m   Visual Acuity Right Eye Distance:   Left Eye Distance:   Bilateral Distance:    Right Eye Near:   Left Eye Near:    Bilateral Near:     Physical Exam Vitals and nursing note reviewed.  Constitutional:      Appearance: She is well-developed.  HENT:     Head: Normocephalic.  Cardiovascular:     Rate and Rhythm: Normal rate.  Pulmonary:     Effort: Pulmonary effort is normal.  Abdominal:     General: There is no distension.  Musculoskeletal:        General: Normal range of motion.     Cervical back: Normal range of motion.  Skin:    General: Skin is warm.  Neurological:     General: No focal deficit present.     Mental Status: She is alert and oriented to person, place, and time.      UC Treatments / Results  Labs (all labs ordered are listed, but only abnormal results are displayed) Labs Reviewed  POCT URINALYSIS DIP (MANUAL ENTRY) - Abnormal; Notable for the following components:      Result Value   Blood, UA large (*)    All other components within normal limits  CERVICOVAGINAL ANCILLARY ONLY    EKG   Radiology No results found.  Procedures Procedures (including critical care time)  Medications Ordered in UC Medications - No data to display  Initial Impression / Assessment and Plan / UC Course  I have reviewed the triage vital signs and the nursing notes.  Pertinent labs & imaging results that were available during my care of the patient were reviewed by me and considered in my medical decision making (see chart for details).     UA shows blood only.   I will cover patient with metronidazole .  I suspect vaginal infection.  Patient is advised to follow-up with her OB/GYN patient is given a prescription for metronidazole  and Voltaren  she is  advised to return if any problems. Final Clinical Impressions(s) / UC Diagnoses   Final diagnoses:  Vaginal bleeding  Bacterial vaginitis     Discharge Instructions      Follow up with your Gynecologist for recheck if symptoms persist    ED Prescriptions     Medication Sig Dispense Auth. Provider   metroNIDAZOLE  (FLAGYL ) 500 MG tablet Take 1 tablet (500 mg total) by mouth 2 (two) times daily for 7 days. 14 tablet Kalynn Declercq K, PA-C   diclofenac  (VOLTAREN ) 75 MG EC tablet Take 1 tablet (75 mg total) by mouth 2 (two) times daily. 20 tablet Ganon Demasi K, PA-C      PDMP not reviewed this encounter.   Flint Sonny POUR, PA-C 11/13/23 1401    Flint Sonny POUR, PA-C 12/15/23 1238

## 2023-11-15 ENCOUNTER — Telehealth: Payer: Commercial Managed Care - PPO | Admitting: Physician Assistant

## 2023-11-15 DIAGNOSIS — R110 Nausea: Secondary | ICD-10-CM | POA: Diagnosis not present

## 2023-11-16 MED ORDER — ONDANSETRON HCL 4 MG PO TABS: 4.0000 mg | ORAL_TABLET | Freq: Three times a day (TID) | ORAL | 0 refills | Status: DC | PRN

## 2023-11-16 NOTE — Progress Notes (Signed)
 I have spent 5 minutes in review of e-visit questionnaire, review and updating patient chart, medical decision making and response to patient.   Laure Kidney, PA-C

## 2023-11-16 NOTE — Progress Notes (Signed)
E-Visit for Nausea and Vomiting   We are sorry that you are not feeling well. Here is how we plan to help!  Based on what you have shared with me it looks like you have a Virus that is irritating your GI tract.  Vomiting is the forceful emptying of a portion of the stomach's content through the mouth.  Although nausea and vomiting can make you feel miserable, it's important to remember that these are not diseases, but rather symptoms of an underlying illness.  When we treat short term symptoms, we always caution that any symptoms that persist should be fully evaluated in a medical office.  I have prescribed a medication that will help alleviate your symptoms and allow you to stay hydrated:  Zofran 4 mg 1 tablet every 8 hours as needed for nausea and vomiting  HOME CARE: Drink clear liquids.  This is very important! Dehydration (the lack of fluid) can lead to a serious complication.  Start off with 1 tablespoon every 5 minutes for 8 hours. You may begin eating bland foods after 8 hours without vomiting.  Start with saltine crackers, white bread, rice, mashed potatoes, applesauce. After 48 hours on a bland diet, you may resume a normal diet. Try to go to sleep.  Sleep often empties the stomach and relieves the need to vomit.  GET HELP RIGHT AWAY IF:  Your symptoms do not improve or worsen within 2 days after treatment. You have a fever for over 3 days. You cannot keep down fluids after trying the medication.  MAKE SURE YOU:  Understand these instructions. Will watch your condition. Will get help right away if you are not doing well or get worse.    Thank you for choosing an e-visit.  Your e-visit answers were reviewed by a board certified advanced clinical practitioner to complete your personal care plan. Depending upon the condition, your plan could have included both over the counter or prescription medications.  Please review your pharmacy choice. Make sure the pharmacy is open so  you can pick up prescription now. If there is a problem, you may contact your provider through MyChart messaging and have the prescription routed to another pharmacy.  Your safety is important to us. If you have drug allergies check your prescription carefully.   For the next 24 hours you can use MyChart to ask questions about today's visit, request a non-urgent call back, or ask for a work or school excuse. You will get an email in the next two days asking about your experience. I hope that your e-visit has been valuable and will speed your recovery.  

## 2023-11-25 ENCOUNTER — Other Ambulatory Visit (HOSPITAL_COMMUNITY): Payer: Self-pay

## 2023-11-25 ENCOUNTER — Telehealth: Payer: Commercial Managed Care - PPO | Admitting: Physician Assistant

## 2023-11-25 DIAGNOSIS — T3695XA Adverse effect of unspecified systemic antibiotic, initial encounter: Secondary | ICD-10-CM

## 2023-11-25 DIAGNOSIS — B379 Candidiasis, unspecified: Secondary | ICD-10-CM

## 2023-11-25 MED ORDER — FLUCONAZOLE 150 MG PO TABS
150.0000 mg | ORAL_TABLET | Freq: Once | ORAL | 0 refills | Status: AC
  Filled 2023-11-25: qty 2, 4d supply, fill #0

## 2023-11-25 NOTE — Progress Notes (Signed)

## 2023-11-25 NOTE — Progress Notes (Signed)
 I have spent 5 minutes in review of e-visit questionnaire, review and updating patient chart, medical decision making and response to patient.   Piedad Climes, PA-C

## 2023-11-27 ENCOUNTER — Other Ambulatory Visit (HOSPITAL_COMMUNITY): Payer: Self-pay

## 2023-11-27 DIAGNOSIS — I10 Essential (primary) hypertension: Secondary | ICD-10-CM | POA: Diagnosis not present

## 2023-11-27 DIAGNOSIS — N898 Other specified noninflammatory disorders of vagina: Secondary | ICD-10-CM | POA: Diagnosis not present

## 2023-11-27 MED ORDER — TERCONAZOLE 0.4 % VA CREA
1.0000 | TOPICAL_CREAM | Freq: Every evening | VAGINAL | 1 refills | Status: DC
  Filled 2023-11-27: qty 45, 7d supply, fill #0
  Filled 2024-04-05: qty 45, 7d supply, fill #1

## 2023-11-27 MED ORDER — FLUCONAZOLE 150 MG PO TABS
150.0000 mg | ORAL_TABLET | ORAL | 1 refills | Status: DC
  Filled 2023-11-27 – 2023-11-28 (×2): qty 3, 7d supply, fill #0
  Filled 2024-04-05: qty 3, 7d supply, fill #1

## 2023-11-28 ENCOUNTER — Other Ambulatory Visit (HOSPITAL_COMMUNITY): Payer: Self-pay

## 2023-12-01 ENCOUNTER — Other Ambulatory Visit (HOSPITAL_COMMUNITY): Payer: Self-pay

## 2023-12-01 ENCOUNTER — Encounter: Payer: Self-pay | Admitting: Family Medicine

## 2023-12-01 ENCOUNTER — Ambulatory Visit (INDEPENDENT_AMBULATORY_CARE_PROVIDER_SITE_OTHER): Payer: Commercial Managed Care - PPO | Admitting: Family Medicine

## 2023-12-01 VITALS — BP 138/82 | HR 100 | Temp 98.3°F | Ht 64.0 in | Wt 234.4 lb

## 2023-12-01 DIAGNOSIS — M7551 Bursitis of right shoulder: Secondary | ICD-10-CM | POA: Diagnosis not present

## 2023-12-01 DIAGNOSIS — I10 Essential (primary) hypertension: Secondary | ICD-10-CM

## 2023-12-01 MED ORDER — AMLODIPINE BESYLATE 5 MG PO TABS
5.0000 mg | ORAL_TABLET | Freq: Every day | ORAL | 0 refills | Status: DC
  Filled 2023-12-01: qty 90, 90d supply, fill #0

## 2023-12-01 MED ORDER — MELOXICAM 7.5 MG PO TABS
7.5000 mg | ORAL_TABLET | Freq: Every day | ORAL | 0 refills | Status: DC
  Filled 2023-12-01: qty 30, 30d supply, fill #0

## 2023-12-01 NOTE — Progress Notes (Signed)
Established Patient Office Visit   Subjective:  Patient ID: [name], female    DOB: Apr 08, 1981  Age: 43 y.o. MRN: [id number]  Chief Complaint  Patient presents with   Shoulder Pain    Right shoulder pain has increased recently.     Shoulder Pain  Pertinent negatives include no tingling.   Encounter Diagnoses  Name Primary?   Essential hypertension Yes   Bursitis of right shoulder    For follow-up of above.  Was seen back in August and asked to follow-up in 6 weeks but has been lost to follow-up since that time.  She has been taking HCTZ intermittently.  She says that it had made her feel "bad" and then she started taking it at night before bed which seemed to be more tolerable.  There is some stomach upset with it.  Right shoulder has started to bother her again.  She does have a history of bursitis that had responded to anti-inflammatories and an injection.  No recent injury.   Review of Systems  Constitutional: Negative.   HENT: Negative.    Eyes:  Negative for blurred vision, discharge and redness.  Respiratory: Negative.    Cardiovascular: Negative.   Gastrointestinal:  Negative for abdominal pain.  Genitourinary: Negative.   Musculoskeletal:  Positive for joint pain. Negative for myalgias.  Skin:  Negative for rash.  Neurological:  Negative for tingling, loss of consciousness, weakness and headaches.  Endo/Heme/Allergies:  Negative for polydipsia.     Current Outpatient Medications:    amLODipine (NORVASC) 5 MG tablet, Take 1 tablet (5 mg total) by mouth daily., Disp: 90 tablet, Rfl: 0   fluconazole (DIFLUCAN) 150 MG tablet, Take 1 tablet (150 mg total) by mouth on day 1, day 3, and day 7, Disp: 3 tablet, Rfl: 1   folic acid (FOLVITE) 1 MG tablet, Take 1 tablet (1 mg total) by mouth daily., Disp: 90 tablet, Rfl: 2   hydrochlorothiazide (HYDRODIURIL) 25 MG tablet, Take 1 tablet (25 mg total) by mouth daily., Disp: 90 tablet, Rfl: 0   meloxicam (MOBIC) 7.5 MG  tablet, Take 1 tablet (7.5 mg total) by mouth daily., Disp: 30 tablet, Rfl: 0   OMEPRAZOLE PO, Take by mouth., Disp: , Rfl:    ondansetron (ZOFRAN) 4 MG tablet, Take 1 tablet (4 mg total) by mouth every 8 (eight) hours as needed for nausea or vomiting., Disp: 20 tablet, Rfl: 0   terconazole (TERAZOL 7) 0.4 % vaginal cream, Place 1 applicator vaginally at bedtime., Disp: 45 g, Rfl: 1   Objective:     BP 138/82   Pulse 100   Temp 98.3 F (36.8 C)   Ht 5\' 4"  (1.626 m)   Wt 234 lb 6.4 oz (106.3 kg)   LMP 11/02/2023 (Exact Date)   SpO2 95%   BMI 40.23 kg/m  BP Readings from Last 3 Encounters:  12/01/23 138/82  11/13/23 (!) 150/90  06/20/23 (!) 148/100   Wt Readings from Last 3 Encounters:  12/01/23 234 lb 6.4 oz (106.3 kg)  11/13/23 225 lb (102.1 kg)  06/20/23 237 lb (107.5 kg)      Physical Exam Constitutional:      General: She is not in acute distress.    Appearance: Normal appearance. She is not ill-appearing, toxic-appearing or diaphoretic.  HENT:     Head: Normocephalic and atraumatic.     Right Ear: External ear normal.     Left Ear: External ear normal.  Eyes:  General: No scleral icterus.       Right eye: No discharge.        Left eye: No discharge.     Extraocular Movements: Extraocular movements intact.     Conjunctiva/sclera: Conjunctivae normal.  Cardiovascular:     Rate and Rhythm: Normal rate and regular rhythm.  Pulmonary:     Effort: Pulmonary effort is normal. No respiratory distress.     Breath sounds: No wheezing, rhonchi or rales.  Musculoskeletal:     Right shoulder: Tenderness present. Normal range of motion.     Comments: Range of motion through her right shoulder is full but uncomfortable.  There is tenderness of the subacromial bursa.  Skin:    General: Skin is warm and dry.  Neurological:     Mental Status: She is alert and oriented to person, place, and time.  Psychiatric:        Mood and Affect: Mood normal.        Behavior:  Behavior normal.      No results found for any visits on 12/01/23.    The 10-year ASCVD risk score (Arnett DK, et al., 2019) is: 7.6%    Assessment & Plan:   Essential hypertension -     Basic metabolic panel -     amLODIPine Besylate; Take 1 tablet (5 mg total) by mouth daily.  Dispense: 90 tablet; Refill: 0  Bursitis of right shoulder -     Meloxicam; Take 1 tablet (7.5 mg total) by mouth daily.  Dispense: 30 tablet; Refill: 0 -     Ambulatory referral to Sports Medicine    Return in about 8 weeks (around 01/26/2024).  Discontinue HCTZ and start amlodipine.  Sports medicine referral for recurrent shoulder bursitis.  Will start meloxicam 7.5 daily.  Emphasized the importance of follow-up for hypertension.    Mliss Sax, MD

## 2023-12-02 ENCOUNTER — Encounter: Payer: Self-pay | Admitting: Family Medicine

## 2023-12-02 LAB — BASIC METABOLIC PANEL
BUN: 17 mg/dL (ref 6–23)
CO2: 26 meq/L (ref 19–32)
Calcium: 9.1 mg/dL (ref 8.4–10.5)
Chloride: 103 meq/L (ref 96–112)
Creatinine, Ser: 1 mg/dL (ref 0.40–1.20)
GFR: 68.55 mL/min (ref >60.00)
Glucose, Bld: 94 mg/dL (ref 70–99)
Potassium: 3.7 meq/L (ref 3.5–5.1)
Sodium: 139 meq/L (ref 135–145)

## 2023-12-15 NOTE — Progress Notes (Signed)
Pt tested positive for bacterial vaginitis.  Pt treated with flagyl.    Diagnosis Bacterial vaginitis

## 2023-12-18 ENCOUNTER — Other Ambulatory Visit (HOSPITAL_COMMUNITY): Payer: Self-pay

## 2023-12-18 ENCOUNTER — Telehealth: Payer: Commercial Managed Care - PPO | Admitting: Physician Assistant

## 2023-12-18 DIAGNOSIS — R6889 Other general symptoms and signs: Secondary | ICD-10-CM | POA: Diagnosis not present

## 2023-12-18 MED ORDER — OSELTAMIVIR PHOSPHATE 75 MG PO CAPS
75.0000 mg | ORAL_CAPSULE | Freq: Two times a day (BID) | ORAL | 0 refills | Status: AC
  Filled 2023-12-18: qty 10, 5d supply, fill #0

## 2023-12-18 NOTE — Progress Notes (Signed)
 I have spent 5 minutes in review of e-visit questionnaire, review and updating patient chart, medical decision making and response to patient.   Piedad Climes, PA-C

## 2023-12-18 NOTE — Progress Notes (Signed)

## 2023-12-25 ENCOUNTER — Telehealth: Payer: Commercial Managed Care - PPO | Admitting: Physician Assistant

## 2023-12-25 DIAGNOSIS — J069 Acute upper respiratory infection, unspecified: Secondary | ICD-10-CM

## 2023-12-25 NOTE — Progress Notes (Signed)
  Because because you have worsening symptoms and were recently seen for a similar issue, I feel your condition warrants further evaluation and I recommend that you be seen in a face-to-face visit.   NOTE: There will be NO CHARGE for this E-Visit   If you are having a true medical emergency, please call 911.     For an urgent face to face visit, [facility] has multiple urgent care centers for your convenience.  Click the link below for the full list of locations and hours, walk-in wait times, appointment scheduling options and driving directions:  Urgent Care - [place], [place], [place], [place], [place], [place]  [place]     Your MyChart E-visit questionnaire answers were reviewed by a board certified advanced clinical practitioner to complete your personal care plan based on your specific symptoms.    Thank you for using e-Visits.

## 2023-12-26 DIAGNOSIS — Z1231 Encounter for screening mammogram for malignant neoplasm of breast: Secondary | ICD-10-CM | POA: Diagnosis not present

## 2024-01-02 ENCOUNTER — Telehealth: Payer: Commercial Managed Care - PPO | Admitting: Physician Assistant

## 2024-01-02 DIAGNOSIS — R051 Acute cough: Secondary | ICD-10-CM

## 2024-01-02 NOTE — Progress Notes (Signed)
  Because you recently were treated for the flu and are continuing with a cough you should be seen in person for lung exam, I feel your condition warrants further evaluation and I recommend that you be seen in a face-to-face visit.   NOTE: There will be NO CHARGE for this E-Visit   If you are having a true medical emergency, please call 911.     For an urgent face to face visit, [facility] has multiple urgent care centers for your convenience.  Click the link below for the full list of locations and hours, walk-in wait times, appointment scheduling options and driving directions:  Urgent Care - [place], [place], [place], [place], [place], [place]  [place]     Your MyChart E-visit questionnaire answers were reviewed by a board certified advanced clinical practitioner to complete your personal care plan based on your specific symptoms.    Thank you for using e-Visits.    Christeen Douglas, PA-C

## 2024-01-15 DIAGNOSIS — J3089 Other allergic rhinitis: Secondary | ICD-10-CM | POA: Diagnosis not present

## 2024-01-15 DIAGNOSIS — J301 Allergic rhinitis due to pollen: Secondary | ICD-10-CM | POA: Diagnosis not present

## 2024-01-15 DIAGNOSIS — J3081 Allergic rhinitis due to animal (cat) (dog) hair and dander: Secondary | ICD-10-CM | POA: Diagnosis not present

## 2024-01-19 DIAGNOSIS — J3089 Other allergic rhinitis: Secondary | ICD-10-CM | POA: Diagnosis not present

## 2024-01-19 DIAGNOSIS — J301 Allergic rhinitis due to pollen: Secondary | ICD-10-CM | POA: Diagnosis not present

## 2024-01-19 DIAGNOSIS — J3081 Allergic rhinitis due to animal (cat) (dog) hair and dander: Secondary | ICD-10-CM | POA: Diagnosis not present

## 2024-01-20 ENCOUNTER — Other Ambulatory Visit: Payer: Self-pay

## 2024-01-20 ENCOUNTER — Ambulatory Visit: Payer: Commercial Managed Care - PPO | Admitting: Family Medicine

## 2024-01-20 VITALS — BP 184/102 | Ht 64.0 in | Wt 240.0 lb

## 2024-01-20 DIAGNOSIS — M25511 Pain in right shoulder: Secondary | ICD-10-CM

## 2024-01-20 DIAGNOSIS — G8929 Other chronic pain: Secondary | ICD-10-CM

## 2024-01-20 NOTE — Progress Notes (Unsigned)
   Rubin Payor, PhD, LAT, ATC acting as a scribe for Clementeen Graham, MD.  [name] is a 43 y.o. female who presents to [facility] at [facility] today for R shoulder pain ongoing since Oct. Pain is waking her up at night. Pt locates pain to the lateral aspect of the R shoulder and lateral pec.  Radiates: no   Aggravates: IR, throwing Treatments tried: prior CSI ~Oct, meloxicam  Pertinent review of systems: Fevers or chills  Relevant historical information: Hypertension.  Obesity.  Patient is 5th grade teacher.   Exam:  BP (!) 184/102   Ht 5\' 4"  (1.626 m)   Wt 240 lb (108.9 kg)   BMI 41.20 kg/m  General: Well Developed, well nourished, and in no acute distress.   MSK: Right shoulder normal-appearing normal motion pain with abduction and functional internal rotation. Strength is intact. Positive Hawkins and Neer's test. Mildly positive Yergason's and speeds test.   Lab and Radiology Results  Procedure: Real-time Ultrasound Guided Injection of right shoulder subacromial bursa Device: Philips Affiniti 50G/GE Logiq Images permanently stored and available for review in PACS Ultrasound evaluation prior to injection reveals intact rotator cuff tendons and moderate subacromial bursitis. Verbal informed consent obtained.  Discussed risks and benefits of procedure. Warned about infection, bleeding, hyperglycemia damage to structures among others. Patient expresses understanding and agreement Time-out conducted.   Noted no overlying erythema, induration, or other signs of local infection.   Skin prepped in a sterile fashion.   Local anesthesia: Topical Ethyl chloride.   With sterile technique and under real time ultrasound guidance: 40 mg of Kenalog and 2 mL's of Marcaine injected into subacromial bursa. Fluid seen entering the bursa.   Completed without difficulty   Pain immediately resolved suggesting accurate placement of the medication.   Advised to  call if fevers/chills, erythema, induration, drainage, or persistent bleeding.   Images permanently stored and available for review in the ultrasound unit.  Impression: Technically successful ultrasound guided injection.        Assessment and Plan: 43 y.o. female with right shoulder pain.  This is a chronic ongoing issue.  Pain at this time due to subacromial bursitis.  Plan for steroid injection and physical therapy referral.  She lives in [place] and commutes to the [place] area.  Will pick [facility] as a location for PT. Recheck in 6 weeks.   PDMP not reviewed this encounter. Orders Placed This Encounter  Procedures   [place] LIMITED JOINT SPACE STRUCTURES UP RIGHT(NO LINKED CHARGES)    Reason for Exam (SYMPTOM  OR DIAGNOSIS REQUIRED):   right shoulder pain    Preferred imaging location?:   [facility] referral to Physical Therapy    Referral Priority:   Routine    Referral Type:   Physical Medicine    Referral Reason:   Specialty Services Required    Requested Specialty:   Physical Therapy    Number of Visits Requested:   1   No orders of the defined types were placed in this encounter.    Discussed warning signs or symptoms. Please see discharge instructions. Patient expresses understanding.   The above documentation has been reviewed and is accurate and complete Clementeen Graham, M.D.

## 2024-01-20 NOTE — Patient Instructions (Addendum)
 Thank you for coming in today.   I've referred you to Physical Therapy.  Let us know if you don't hear from them in one week.   Call or go to the ER if you develop a large red swollen joint with extreme pain or oozing puss.    Let me know how this goes.

## 2024-01-21 DIAGNOSIS — J301 Allergic rhinitis due to pollen: Secondary | ICD-10-CM | POA: Diagnosis not present

## 2024-01-21 DIAGNOSIS — J3081 Allergic rhinitis due to animal (cat) (dog) hair and dander: Secondary | ICD-10-CM | POA: Diagnosis not present

## 2024-01-21 DIAGNOSIS — J3089 Other allergic rhinitis: Secondary | ICD-10-CM | POA: Diagnosis not present

## 2024-01-26 DIAGNOSIS — J3081 Allergic rhinitis due to animal (cat) (dog) hair and dander: Secondary | ICD-10-CM | POA: Diagnosis not present

## 2024-01-26 DIAGNOSIS — J301 Allergic rhinitis due to pollen: Secondary | ICD-10-CM | POA: Diagnosis not present

## 2024-01-26 DIAGNOSIS — J3089 Other allergic rhinitis: Secondary | ICD-10-CM | POA: Diagnosis not present

## 2024-02-03 DIAGNOSIS — J3089 Other allergic rhinitis: Secondary | ICD-10-CM | POA: Diagnosis not present

## 2024-02-03 DIAGNOSIS — J301 Allergic rhinitis due to pollen: Secondary | ICD-10-CM | POA: Diagnosis not present

## 2024-02-03 DIAGNOSIS — J3081 Allergic rhinitis due to animal (cat) (dog) hair and dander: Secondary | ICD-10-CM | POA: Diagnosis not present

## 2024-02-06 DIAGNOSIS — J301 Allergic rhinitis due to pollen: Secondary | ICD-10-CM | POA: Diagnosis not present

## 2024-02-06 DIAGNOSIS — J3081 Allergic rhinitis due to animal (cat) (dog) hair and dander: Secondary | ICD-10-CM | POA: Diagnosis not present

## 2024-02-06 DIAGNOSIS — J3089 Other allergic rhinitis: Secondary | ICD-10-CM | POA: Diagnosis not present

## 2024-02-09 DIAGNOSIS — J3081 Allergic rhinitis due to animal (cat) (dog) hair and dander: Secondary | ICD-10-CM | POA: Diagnosis not present

## 2024-02-09 DIAGNOSIS — J3089 Other allergic rhinitis: Secondary | ICD-10-CM | POA: Diagnosis not present

## 2024-02-09 DIAGNOSIS — J301 Allergic rhinitis due to pollen: Secondary | ICD-10-CM | POA: Diagnosis not present

## 2024-02-13 DIAGNOSIS — J3081 Allergic rhinitis due to animal (cat) (dog) hair and dander: Secondary | ICD-10-CM | POA: Diagnosis not present

## 2024-02-13 DIAGNOSIS — J301 Allergic rhinitis due to pollen: Secondary | ICD-10-CM | POA: Diagnosis not present

## 2024-02-13 DIAGNOSIS — J3089 Other allergic rhinitis: Secondary | ICD-10-CM | POA: Diagnosis not present

## 2024-02-17 DIAGNOSIS — J301 Allergic rhinitis due to pollen: Secondary | ICD-10-CM | POA: Diagnosis not present

## 2024-02-17 DIAGNOSIS — J3089 Other allergic rhinitis: Secondary | ICD-10-CM | POA: Diagnosis not present

## 2024-02-17 DIAGNOSIS — J3081 Allergic rhinitis due to animal (cat) (dog) hair and dander: Secondary | ICD-10-CM | POA: Diagnosis not present

## 2024-02-24 DIAGNOSIS — J3089 Other allergic rhinitis: Secondary | ICD-10-CM | POA: Diagnosis not present

## 2024-02-24 DIAGNOSIS — J301 Allergic rhinitis due to pollen: Secondary | ICD-10-CM | POA: Diagnosis not present

## 2024-02-24 DIAGNOSIS — J3081 Allergic rhinitis due to animal (cat) (dog) hair and dander: Secondary | ICD-10-CM | POA: Diagnosis not present

## 2024-03-01 DIAGNOSIS — J3089 Other allergic rhinitis: Secondary | ICD-10-CM | POA: Diagnosis not present

## 2024-03-01 DIAGNOSIS — J301 Allergic rhinitis due to pollen: Secondary | ICD-10-CM | POA: Diagnosis not present

## 2024-03-01 DIAGNOSIS — J3081 Allergic rhinitis due to animal (cat) (dog) hair and dander: Secondary | ICD-10-CM | POA: Diagnosis not present

## 2024-03-08 DIAGNOSIS — J301 Allergic rhinitis due to pollen: Secondary | ICD-10-CM | POA: Diagnosis not present

## 2024-03-08 DIAGNOSIS — J3089 Other allergic rhinitis: Secondary | ICD-10-CM | POA: Diagnosis not present

## 2024-03-08 DIAGNOSIS — J3081 Allergic rhinitis due to animal (cat) (dog) hair and dander: Secondary | ICD-10-CM | POA: Diagnosis not present

## 2024-03-16 ENCOUNTER — Ambulatory Visit: Admitting: Family Medicine

## 2024-03-16 DIAGNOSIS — J3081 Allergic rhinitis due to animal (cat) (dog) hair and dander: Secondary | ICD-10-CM | POA: Diagnosis not present

## 2024-03-16 DIAGNOSIS — J301 Allergic rhinitis due to pollen: Secondary | ICD-10-CM | POA: Diagnosis not present

## 2024-03-16 DIAGNOSIS — J3089 Other allergic rhinitis: Secondary | ICD-10-CM | POA: Diagnosis not present

## 2024-03-18 DIAGNOSIS — J301 Allergic rhinitis due to pollen: Secondary | ICD-10-CM | POA: Diagnosis not present

## 2024-03-18 DIAGNOSIS — J3081 Allergic rhinitis due to animal (cat) (dog) hair and dander: Secondary | ICD-10-CM | POA: Diagnosis not present

## 2024-03-18 DIAGNOSIS — J3089 Other allergic rhinitis: Secondary | ICD-10-CM | POA: Diagnosis not present

## 2024-03-22 DIAGNOSIS — J301 Allergic rhinitis due to pollen: Secondary | ICD-10-CM | POA: Diagnosis not present

## 2024-03-22 DIAGNOSIS — J3081 Allergic rhinitis due to animal (cat) (dog) hair and dander: Secondary | ICD-10-CM | POA: Diagnosis not present

## 2024-03-22 DIAGNOSIS — J3089 Other allergic rhinitis: Secondary | ICD-10-CM | POA: Diagnosis not present

## 2024-03-24 DIAGNOSIS — J3081 Allergic rhinitis due to animal (cat) (dog) hair and dander: Secondary | ICD-10-CM | POA: Diagnosis not present

## 2024-03-24 DIAGNOSIS — J301 Allergic rhinitis due to pollen: Secondary | ICD-10-CM | POA: Diagnosis not present

## 2024-03-24 DIAGNOSIS — J3089 Other allergic rhinitis: Secondary | ICD-10-CM | POA: Diagnosis not present

## 2024-04-05 ENCOUNTER — Other Ambulatory Visit: Payer: Self-pay | Admitting: Family Medicine

## 2024-04-05 ENCOUNTER — Other Ambulatory Visit (HOSPITAL_COMMUNITY): Payer: Self-pay

## 2024-04-05 DIAGNOSIS — I10 Essential (primary) hypertension: Secondary | ICD-10-CM

## 2024-04-05 DIAGNOSIS — E538 Deficiency of other specified B group vitamins: Secondary | ICD-10-CM

## 2024-04-05 MED ORDER — AMLODIPINE BESYLATE 5 MG PO TABS
5.0000 mg | ORAL_TABLET | Freq: Every day | ORAL | 0 refills | Status: DC
  Filled 2024-04-05: qty 90, 90d supply, fill #0

## 2024-04-05 MED ORDER — FOLIC ACID 1 MG PO TABS
1.0000 mg | ORAL_TABLET | Freq: Every day | ORAL | 2 refills | Status: AC
  Filled 2024-04-05: qty 90, 90d supply, fill #0
  Filled 2024-11-29 – 2024-12-14 (×2): qty 90, 90d supply, fill #1

## 2024-04-06 ENCOUNTER — Other Ambulatory Visit (HOSPITAL_COMMUNITY): Payer: Self-pay

## 2024-04-07 DIAGNOSIS — J3081 Allergic rhinitis due to animal (cat) (dog) hair and dander: Secondary | ICD-10-CM | POA: Diagnosis not present

## 2024-04-07 DIAGNOSIS — J3089 Other allergic rhinitis: Secondary | ICD-10-CM | POA: Diagnosis not present

## 2024-04-07 DIAGNOSIS — J301 Allergic rhinitis due to pollen: Secondary | ICD-10-CM | POA: Diagnosis not present

## 2024-04-28 DIAGNOSIS — J3081 Allergic rhinitis due to animal (cat) (dog) hair and dander: Secondary | ICD-10-CM | POA: Diagnosis not present

## 2024-04-28 DIAGNOSIS — J3089 Other allergic rhinitis: Secondary | ICD-10-CM | POA: Diagnosis not present

## 2024-04-28 DIAGNOSIS — J301 Allergic rhinitis due to pollen: Secondary | ICD-10-CM | POA: Diagnosis not present

## 2024-04-29 ENCOUNTER — Encounter: Payer: Self-pay | Admitting: Family Medicine

## 2024-04-29 ENCOUNTER — Other Ambulatory Visit (HOSPITAL_COMMUNITY): Payer: Self-pay

## 2024-04-29 ENCOUNTER — Ambulatory Visit: Admitting: Family Medicine

## 2024-04-29 VITALS — BP 124/80 | HR 81 | Temp 99.5°F | Ht 64.0 in | Wt 231.2 lb

## 2024-04-29 DIAGNOSIS — I10 Essential (primary) hypertension: Secondary | ICD-10-CM

## 2024-04-29 DIAGNOSIS — M25551 Pain in right hip: Secondary | ICD-10-CM | POA: Diagnosis not present

## 2024-04-29 DIAGNOSIS — Z23 Encounter for immunization: Secondary | ICD-10-CM | POA: Insufficient documentation

## 2024-04-29 MED ORDER — AMLODIPINE BESYLATE 5 MG PO TABS
5.0000 mg | ORAL_TABLET | Freq: Every day | ORAL | 0 refills | Status: DC
  Filled 2024-04-29: qty 90, 90d supply, fill #0

## 2024-04-29 MED ORDER — AMLODIPINE BESYLATE 5 MG PO TABS
5.0000 mg | ORAL_TABLET | Freq: Every day | ORAL | 2 refills | Status: AC
  Filled 2024-04-29 – 2024-12-14 (×4): qty 90, 90d supply, fill #0

## 2024-04-29 MED ORDER — MELOXICAM 7.5 MG PO TABS
7.5000 mg | ORAL_TABLET | Freq: Every day | ORAL | 1 refills | Status: AC | PRN
  Filled 2024-04-29: qty 30, 30d supply, fill #0
  Filled 2024-12-14: qty 30, 30d supply, fill #1

## 2024-04-29 NOTE — Progress Notes (Signed)
 Established Patient Office Visit   Subjective:  Patient ID: [name], female    DOB: 03-May-1981  Age: 43 y.o. MRN: [id number]  Chief Complaint  Patient presents with   Hip Pain    Since last visit - started around April. Hurting daily, waking out of sleep. Has tries streches. The meloxicam  that was prescribed for shoulder pain has helped with this.   Shoulder Pain    Much better. Received cortisone injection x1. Has not gone to PT - too many missed appointments, dismissed.    Hip Pain  Pertinent negatives include no tingling.  Shoulder Pain  Pertinent negatives include no tingling.   Encounter Diagnoses  Name Primary?   Essential hypertension Yes   Immunization due    Right hip pain    For follow-up of above.  Blood pressure is well-controlled with amlodipine  5 mg daily.  She is tolerating the drug well.  She has never had the HPV vaccine and would like to start the series today.  Shoulder is doing better after a subacromial injection.  She has developed right hip pain.  Pain is located primarily in her anterior hip area.  The lateral area of her thigh is not involved.  There is increased pain with walking.  She can only walk about a mile or so before it starts to hurt.  The meloxicam  has been helpful.  There has been no injury.   Review of Systems  Constitutional: Negative.   HENT: Negative.    Eyes:  Negative for blurred vision, discharge and redness.  Respiratory: Negative.    Cardiovascular: Negative.   Gastrointestinal:  Negative for abdominal pain.  Genitourinary: Negative.   Musculoskeletal:  Positive for joint pain. Negative for myalgias.  Skin:  Negative for rash.  Neurological:  Negative for tingling, loss of consciousness and weakness.  Endo/Heme/Allergies:  Negative for polydipsia.     Current Outpatient Medications:    folic acid  (FOLVITE ) 1 MG tablet, Take 1 tablet (1 mg total) by mouth daily., Disp: 90 tablet, Rfl: 2   OMEPRAZOLE  PO, Take by  mouth., Disp: , Rfl:    amLODipine  (NORVASC ) 5 MG tablet, Take 1 tablet (5 mg total) by mouth daily., Disp: 90 tablet, Rfl: 2   fluconazole  (DIFLUCAN ) 150 MG tablet, Take 1 tablet (150 mg total) by mouth on day 1, day 3, and day 7 (Patient not taking: Reported on 04/29/2024), Disp: 3 tablet, Rfl: 1   meloxicam  (MOBIC ) 7.5 MG tablet, Take 1 tablet (7.5 mg total) by mouth daily as needed for pain., Disp: 30 tablet, Rfl: 1   ondansetron  (ZOFRAN ) 4 MG tablet, Take 1 tablet (4 mg total) by mouth every 8 (eight) hours as needed for nausea or vomiting. (Patient not taking: Reported on 04/29/2024), Disp: 20 tablet, Rfl: 0   terconazole  (TERAZOL 7 ) 0.4 % vaginal cream, Place 1 applicator vaginally at bedtime. (Patient not taking: Reported on 04/29/2024), Disp: 45 g, Rfl: 1   Objective:     BP 124/80 (BP Location: Right Arm, Patient Position: Sitting)   Pulse 81   Temp 99.5 F (37.5 C) (Axillary)   Ht 5' 4 (1.626 m)   Wt 231 lb 3.2 oz (104.9 kg)   SpO2 (!) 81% Comment: False [place]ails  BMI 39.69 kg/m    Physical Exam Constitutional:      General: She is not in acute distress.    Appearance: Normal appearance. She is not ill-appearing, toxic-appearing or diaphoretic.  HENT:     Head: Normocephalic  and atraumatic.     Right Ear: External ear normal.     Left Ear: External ear normal.     Mouth/Throat:     Mouth: Mucous membranes are moist.     Pharynx: Oropharynx is clear. No oropharyngeal exudate or posterior oropharyngeal erythema.   Eyes:     General: No scleral icterus.       Right eye: No discharge.        Left eye: No discharge.     Extraocular Movements: Extraocular movements intact.     Conjunctiva/sclera: Conjunctivae normal.     Pupils: Pupils are equal, round, and reactive to light.    Cardiovascular:     Rate and Rhythm: Normal rate and regular rhythm.  Pulmonary:     Effort: Pulmonary effort is normal. No respiratory distress.     Breath sounds: Normal breath sounds. No  wheezing or rales.  Abdominal:     General: Bowel sounds are normal.   Musculoskeletal:     Cervical back: No rigidity or tenderness.     Right hip: Bony tenderness present. Normal range of motion. Normal strength.     Left hip: No tenderness. Normal range of motion. Normal strength.       Legs:   Skin:    General: Skin is warm and dry.   Neurological:     Mental Status: She is alert and oriented to person, place, and time.   Psychiatric:        Mood and Affect: Mood normal.        Behavior: Behavior normal.      No results found for any visits on 04/29/24.    The 10-year ASCVD risk score (Arnett DK, et al., 2019) is: 4.5%    Assessment & Plan:   Essential hypertension -     amLODIPine  Besylate; Take 1 tablet (5 mg total) by mouth daily.  Dispense: 90 tablet; Refill: 2  Immunization due -     HPV 9-valent vaccine,Recombinat  Right hip pain -     DG HIP UNILAT W OR W/O PELVIS 2-3 VIEWS RIGHT; Future -     Meloxicam ; Take 1 tablet (7.5 mg total) by mouth daily as needed for pain.  Dispense: 30 tablet; Refill: 1    Return in about 3 months (around 07/30/2024), or if symptoms worsen or fail to improve, for annual physical.    Tonna Frederic, MD

## 2024-05-05 DIAGNOSIS — J301 Allergic rhinitis due to pollen: Secondary | ICD-10-CM | POA: Diagnosis not present

## 2024-05-05 DIAGNOSIS — J3081 Allergic rhinitis due to animal (cat) (dog) hair and dander: Secondary | ICD-10-CM | POA: Diagnosis not present

## 2024-05-05 DIAGNOSIS — J3089 Other allergic rhinitis: Secondary | ICD-10-CM | POA: Diagnosis not present

## 2024-05-11 ENCOUNTER — Ambulatory Visit
Admission: RE | Admit: 2024-05-11 | Discharge: 2024-05-11 | Disposition: A | Discharge status: Home / Self Care | Source: Ambulatory Visit | Attending: Family Medicine | Admitting: Family Medicine

## 2024-05-11 DIAGNOSIS — M25551 Pain in right hip: Secondary | ICD-10-CM | POA: Diagnosis not present

## 2024-05-12 DIAGNOSIS — J3081 Allergic rhinitis due to animal (cat) (dog) hair and dander: Secondary | ICD-10-CM | POA: Diagnosis not present

## 2024-05-12 DIAGNOSIS — J3089 Other allergic rhinitis: Secondary | ICD-10-CM | POA: Diagnosis not present

## 2024-05-12 DIAGNOSIS — J301 Allergic rhinitis due to pollen: Secondary | ICD-10-CM | POA: Diagnosis not present

## 2024-05-24 DIAGNOSIS — J301 Allergic rhinitis due to pollen: Secondary | ICD-10-CM | POA: Diagnosis not present

## 2024-05-24 DIAGNOSIS — J3089 Other allergic rhinitis: Secondary | ICD-10-CM | POA: Diagnosis not present

## 2024-05-24 DIAGNOSIS — J3081 Allergic rhinitis due to animal (cat) (dog) hair and dander: Secondary | ICD-10-CM | POA: Diagnosis not present

## 2024-06-03 DIAGNOSIS — J3081 Allergic rhinitis due to animal (cat) (dog) hair and dander: Secondary | ICD-10-CM | POA: Diagnosis not present

## 2024-06-03 DIAGNOSIS — J3089 Other allergic rhinitis: Secondary | ICD-10-CM | POA: Diagnosis not present

## 2024-06-03 DIAGNOSIS — J301 Allergic rhinitis due to pollen: Secondary | ICD-10-CM | POA: Diagnosis not present

## 2024-06-07 DIAGNOSIS — J301 Allergic rhinitis due to pollen: Secondary | ICD-10-CM | POA: Diagnosis not present

## 2024-06-07 DIAGNOSIS — J3089 Other allergic rhinitis: Secondary | ICD-10-CM | POA: Diagnosis not present

## 2024-06-07 DIAGNOSIS — J3081 Allergic rhinitis due to animal (cat) (dog) hair and dander: Secondary | ICD-10-CM | POA: Diagnosis not present

## 2024-06-09 DIAGNOSIS — J301 Allergic rhinitis due to pollen: Secondary | ICD-10-CM | POA: Diagnosis not present

## 2024-06-09 DIAGNOSIS — J3089 Other allergic rhinitis: Secondary | ICD-10-CM | POA: Diagnosis not present

## 2024-06-09 DIAGNOSIS — J3081 Allergic rhinitis due to animal (cat) (dog) hair and dander: Secondary | ICD-10-CM | POA: Diagnosis not present

## 2024-06-16 DIAGNOSIS — J3089 Other allergic rhinitis: Secondary | ICD-10-CM | POA: Diagnosis not present

## 2024-06-16 DIAGNOSIS — J3081 Allergic rhinitis due to animal (cat) (dog) hair and dander: Secondary | ICD-10-CM | POA: Diagnosis not present

## 2024-06-16 DIAGNOSIS — J301 Allergic rhinitis due to pollen: Secondary | ICD-10-CM | POA: Diagnosis not present

## 2024-06-22 DIAGNOSIS — J3081 Allergic rhinitis due to animal (cat) (dog) hair and dander: Secondary | ICD-10-CM | POA: Diagnosis not present

## 2024-06-22 DIAGNOSIS — J3089 Other allergic rhinitis: Secondary | ICD-10-CM | POA: Diagnosis not present

## 2024-06-22 DIAGNOSIS — J301 Allergic rhinitis due to pollen: Secondary | ICD-10-CM | POA: Diagnosis not present

## 2024-06-29 ENCOUNTER — Ambulatory Visit

## 2024-06-30 DIAGNOSIS — J301 Allergic rhinitis due to pollen: Secondary | ICD-10-CM | POA: Diagnosis not present

## 2024-06-30 DIAGNOSIS — J3089 Other allergic rhinitis: Secondary | ICD-10-CM | POA: Diagnosis not present

## 2024-06-30 DIAGNOSIS — J3081 Allergic rhinitis due to animal (cat) (dog) hair and dander: Secondary | ICD-10-CM | POA: Diagnosis not present

## 2024-07-08 DIAGNOSIS — J301 Allergic rhinitis due to pollen: Secondary | ICD-10-CM | POA: Diagnosis not present

## 2024-07-08 DIAGNOSIS — J3081 Allergic rhinitis due to animal (cat) (dog) hair and dander: Secondary | ICD-10-CM | POA: Diagnosis not present

## 2024-07-08 DIAGNOSIS — J3089 Other allergic rhinitis: Secondary | ICD-10-CM | POA: Diagnosis not present

## 2024-07-23 ENCOUNTER — Other Ambulatory Visit (HOSPITAL_BASED_OUTPATIENT_CLINIC_OR_DEPARTMENT_OTHER): Payer: Self-pay

## 2024-07-23 DIAGNOSIS — J301 Allergic rhinitis due to pollen: Secondary | ICD-10-CM | POA: Diagnosis not present

## 2024-07-23 DIAGNOSIS — J3081 Allergic rhinitis due to animal (cat) (dog) hair and dander: Secondary | ICD-10-CM | POA: Diagnosis not present

## 2024-07-23 DIAGNOSIS — J3089 Other allergic rhinitis: Secondary | ICD-10-CM | POA: Diagnosis not present

## 2024-07-23 MED ORDER — EPINEPHRINE 0.3 MG/0.3ML IJ SOAJ
0.3000 mg | INTRAMUSCULAR | 1 refills | Status: AC
  Filled 2024-07-23 – 2024-08-02 (×2): qty 2, 2d supply, fill #0
  Filled 2024-11-29 – 2024-12-14 (×2): qty 2, 2d supply, fill #1

## 2024-07-29 ENCOUNTER — Other Ambulatory Visit (HOSPITAL_COMMUNITY): Payer: Self-pay

## 2024-07-29 ENCOUNTER — Telehealth: Admitting: Physician Assistant

## 2024-07-29 DIAGNOSIS — J069 Acute upper respiratory infection, unspecified: Secondary | ICD-10-CM

## 2024-07-29 MED ORDER — BENZONATATE 100 MG PO CAPS
100.0000 mg | ORAL_CAPSULE | Freq: Three times a day (TID) | ORAL | 0 refills | Status: DC | PRN
  Filled 2024-07-29: qty 30, 10d supply, fill #0

## 2024-07-29 NOTE — Progress Notes (Signed)
 I have spent 5 minutes in review of e-visit questionnaire, review and updating patient chart, medical decision making and response to patient.   Elsie Velma Lunger, PA-C

## 2024-07-29 NOTE — Progress Notes (Signed)

## 2024-07-30 ENCOUNTER — Encounter: Admitting: Family Medicine

## 2024-08-02 ENCOUNTER — Other Ambulatory Visit (HOSPITAL_COMMUNITY): Payer: Self-pay

## 2024-08-02 ENCOUNTER — Other Ambulatory Visit (HOSPITAL_BASED_OUTPATIENT_CLINIC_OR_DEPARTMENT_OTHER): Payer: Self-pay

## 2024-08-04 DIAGNOSIS — J3081 Allergic rhinitis due to animal (cat) (dog) hair and dander: Secondary | ICD-10-CM | POA: Diagnosis not present

## 2024-08-04 DIAGNOSIS — J3089 Other allergic rhinitis: Secondary | ICD-10-CM | POA: Diagnosis not present

## 2024-08-04 DIAGNOSIS — J301 Allergic rhinitis due to pollen: Secondary | ICD-10-CM | POA: Diagnosis not present

## 2024-08-13 DIAGNOSIS — J3081 Allergic rhinitis due to animal (cat) (dog) hair and dander: Secondary | ICD-10-CM | POA: Diagnosis not present

## 2024-08-13 DIAGNOSIS — J301 Allergic rhinitis due to pollen: Secondary | ICD-10-CM | POA: Diagnosis not present

## 2024-08-13 DIAGNOSIS — J3089 Other allergic rhinitis: Secondary | ICD-10-CM | POA: Diagnosis not present

## 2024-08-24 DIAGNOSIS — H524 Presbyopia: Secondary | ICD-10-CM | POA: Diagnosis not present

## 2024-08-24 DIAGNOSIS — J3081 Allergic rhinitis due to animal (cat) (dog) hair and dander: Secondary | ICD-10-CM | POA: Diagnosis not present

## 2024-08-24 DIAGNOSIS — J3089 Other allergic rhinitis: Secondary | ICD-10-CM | POA: Diagnosis not present

## 2024-08-24 DIAGNOSIS — J301 Allergic rhinitis due to pollen: Secondary | ICD-10-CM | POA: Diagnosis not present

## 2024-08-30 DIAGNOSIS — J301 Allergic rhinitis due to pollen: Secondary | ICD-10-CM | POA: Diagnosis not present

## 2024-08-30 DIAGNOSIS — J3089 Other allergic rhinitis: Secondary | ICD-10-CM | POA: Diagnosis not present

## 2024-08-30 DIAGNOSIS — J3081 Allergic rhinitis due to animal (cat) (dog) hair and dander: Secondary | ICD-10-CM | POA: Diagnosis not present

## 2024-09-02 DIAGNOSIS — J3089 Other allergic rhinitis: Secondary | ICD-10-CM | POA: Diagnosis not present

## 2024-09-02 DIAGNOSIS — J3081 Allergic rhinitis due to animal (cat) (dog) hair and dander: Secondary | ICD-10-CM | POA: Diagnosis not present

## 2024-09-02 DIAGNOSIS — J301 Allergic rhinitis due to pollen: Secondary | ICD-10-CM | POA: Diagnosis not present

## 2024-09-06 ENCOUNTER — Other Ambulatory Visit (HOSPITAL_COMMUNITY): Payer: Self-pay

## 2024-09-06 MED ORDER — MONTELUKAST SODIUM 10 MG PO TABS
10.0000 mg | ORAL_TABLET | Freq: Every day | ORAL | 5 refills | Status: AC
  Filled 2024-09-06: qty 30, 30d supply, fill #0
  Filled 2024-11-29 – 2024-12-14 (×2): qty 30, 30d supply, fill #1

## 2024-09-06 MED ORDER — ALBUTEROL SULFATE HFA 108 (90 BASE) MCG/ACT IN AERS
1.0000 | INHALATION_SPRAY | RESPIRATORY_TRACT | 0 refills | Status: AC | PRN
  Filled 2024-09-06: qty 6.7, 25d supply, fill #0

## 2024-11-19 ENCOUNTER — Other Ambulatory Visit (HOSPITAL_COMMUNITY): Payer: Self-pay

## 2024-11-19 ENCOUNTER — Telehealth: Admitting: Physician Assistant

## 2024-11-19 DIAGNOSIS — J069 Acute upper respiratory infection, unspecified: Secondary | ICD-10-CM

## 2024-11-19 MED ORDER — BENZONATATE 100 MG PO CAPS
100.0000 mg | ORAL_CAPSULE | Freq: Three times a day (TID) | ORAL | 0 refills | Status: AC | PRN
  Filled 2024-11-19: qty 21, 7d supply, fill #0

## 2024-11-19 NOTE — Progress Notes (Signed)
 We are sorry that you are not feeling well.  Here is how we plan to help!  Based on your presentation I believe you most likely have A cough due to a virus.  This is called viral bronchitis and is best treated by rest, plenty of fluids and control of the cough.  You may use Ibuprofen or Tylenol  as directed to help your symptoms.     In addition you may use A prescription cough medication called Tessalon  Perles 100mg . You may take 1-2 capsules every 8 hours as needed for your cough.   From your responses in the eVisit questionnaire you describe inflammation in the upper respiratory tract which is causing a significant cough.  This is commonly called Bronchitis and has four common causes:   Allergies Viral Infections Acid Reflux Bacterial Infection Allergies, viruses and acid reflux are treated by controlling symptoms or eliminating the cause. An example might be a cough caused by taking certain blood pressure medications. You stop the cough by changing the medication. Another example might be a cough caused by acid reflux. Controlling the reflux helps control the cough.  USE OF BRONCHODILATOR (RESCUE) INHALERS: There is a risk from using your bronchodilator too frequently.  The risk is that over-reliance on a medication which only relaxes the muscles surrounding the breathing tubes can reduce the effectiveness of medications prescribed to reduce swelling and congestion of the tubes themselves.  Although you feel brief relief from the bronchodilator inhaler, your asthma may actually be worsening with the tubes becoming more swollen and filled with mucus.  This can delay other crucial treatments, such as oral steroid medications. If you need to use a bronchodilator inhaler daily, several times per day, you should discuss this with your provider.  There are probably better treatments that could be used to keep your asthma under control.     HOME CARE Only take medications as instructed by your  medical team. Complete the entire course of an antibiotic. Drink plenty of fluids and get plenty of rest. Avoid close contacts especially the very young and the elderly Cover your mouth if you cough or cough into your sleeve. Always remember to wash your hands A steam or ultrasonic humidifier can help congestion.   GET HELP RIGHT AWAY IF: You develop worsening fever. You become short of breath You cough up blood. Your symptoms persist after you have completed your treatment plan MAKE SURE YOU  Understand these instructions. Will watch your condition. Will get help right away if you are not doing well or get worse.  Your e-visit answers were reviewed by a board certified advanced clinical practitioner to complete your personal care plan.  Depending on the condition, your plan could have included both over the counter or prescription medications. If there is a problem please reply  once you have received a response from your provider. Your safety is important to us .  If you have drug allergies check your prescription carefully.    You can use MyChart to ask questions about today's visit, request a non-urgent call back, or ask for a work or school excuse for 24 hours related to this e-Visit. If it has been greater than 24 hours you will need to follow up with your provider, or enter a new e-Visit to address those concerns. You will get an e-mail in the next two days asking about your experience.  I hope that your e-visit has been valuable and will speed your recovery. Thank you for using e-visits.  I have spent 5 minutes in review of e-visit questionnaire, review and updating patient chart, medical decision making and response to patient.   Kolbie Lepkowski, PA-C

## 2024-11-29 ENCOUNTER — Other Ambulatory Visit (HOSPITAL_COMMUNITY): Payer: Self-pay

## 2024-12-10 ENCOUNTER — Other Ambulatory Visit (HOSPITAL_COMMUNITY): Payer: Self-pay

## 2024-12-14 ENCOUNTER — Other Ambulatory Visit (HOSPITAL_COMMUNITY): Payer: Self-pay

## 2024-12-30 ENCOUNTER — Encounter: Admitting: Family Medicine

## 2025-04-11 ENCOUNTER — Encounter: Admitting: Family Medicine
